# Patient Record
Sex: Female | Born: 1965
Health system: Southern US, Community
[De-identification: ages and names within clinical notes are randomized; demographics above are authoritative.]

## PROBLEM LIST (undated history)

## (undated) DIAGNOSIS — R319 Hematuria, unspecified: Secondary | ICD-10-CM

## (undated) DIAGNOSIS — I2510 Atherosclerotic heart disease of native coronary artery without angina pectoris: Secondary | ICD-10-CM

## (undated) DIAGNOSIS — E041 Nontoxic single thyroid nodule: Secondary | ICD-10-CM

## (undated) DIAGNOSIS — E785 Hyperlipidemia, unspecified: Secondary | ICD-10-CM

## (undated) DIAGNOSIS — R109 Unspecified abdominal pain: Secondary | ICD-10-CM

## (undated) DIAGNOSIS — I10 Essential (primary) hypertension: Secondary | ICD-10-CM

## (undated) DIAGNOSIS — R0602 Shortness of breath: Secondary | ICD-10-CM

## (undated) DIAGNOSIS — N390 Urinary tract infection, site not specified: Secondary | ICD-10-CM

## (undated) DIAGNOSIS — N950 Postmenopausal bleeding: Secondary | ICD-10-CM

## (undated) DIAGNOSIS — K760 Fatty (change of) liver, not elsewhere classified: Secondary | ICD-10-CM

## (undated) HISTORY — DX: Hematuria, unspecified: R31.9

## (undated) HISTORY — DX: Hyperlipidemia, unspecified: E78.5

## (undated) HISTORY — PX: OTHER SURGICAL HISTORY: SHX169

## (undated) HISTORY — PX: CYSTOSCOPY: SUR368

## (undated) HISTORY — PX: TUBAL LIGATION: SHX77

## (undated) HISTORY — DX: Urinary tract infection, site not specified: N39.0

## (undated) HISTORY — PX: WISDOM TOOTH EXTRACTION: SHX21

## (undated) HISTORY — DX: Essential (primary) hypertension: I10

## (undated) HISTORY — DX: Shortness of breath: R06.02

## (undated) HISTORY — PX: CYST REMOVAL HAND: SHX6279

## (undated) HISTORY — DX: Unspecified abdominal pain: R10.9

## (undated) HISTORY — DX: Atherosclerotic heart disease of native coronary artery without angina pectoris: I25.10

## (undated) HISTORY — PX: KNEE SURGERY: SHX244

## (undated) HISTORY — PX: CERVICAL DISC ARTHROPLASTY: SHX587

## (undated) HISTORY — DX: Fatty (change of) liver, not elsewhere classified: K76.0

---

## 1998-08-30 DIAGNOSIS — J189 Pneumonia, unspecified organism: Secondary | ICD-10-CM

## 1998-08-30 HISTORY — DX: Pneumonia, unspecified organism: J18.9

## 1998-09-12 ENCOUNTER — Encounter: Payer: Self-pay | Admitting: Obstetrics and Gynecology

## 1998-09-12 ENCOUNTER — Inpatient Hospital Stay (HOSPITAL_COMMUNITY): Admission: RE | Admit: 1998-09-12 | Discharge: 1998-09-14 | Payer: Self-pay | Admitting: Obstetrics and Gynecology

## 1998-11-01 ENCOUNTER — Inpatient Hospital Stay (HOSPITAL_COMMUNITY): Admission: AD | Admit: 1998-11-01 | Discharge: 1998-11-01 | Payer: Self-pay | Admitting: Obstetrics & Gynecology

## 1998-11-03 ENCOUNTER — Inpatient Hospital Stay (HOSPITAL_COMMUNITY): Admission: AD | Admit: 1998-11-03 | Discharge: 1998-11-05 | Payer: Self-pay | Admitting: Obstetrics and Gynecology

## 1998-12-05 ENCOUNTER — Other Ambulatory Visit: Admission: RE | Admit: 1998-12-05 | Discharge: 1998-12-05 | Payer: Self-pay | Admitting: Obstetrics and Gynecology

## 1999-12-19 ENCOUNTER — Other Ambulatory Visit: Admission: RE | Admit: 1999-12-19 | Discharge: 1999-12-19 | Payer: Self-pay | Admitting: Obstetrics & Gynecology

## 2000-01-15 ENCOUNTER — Emergency Department (HOSPITAL_COMMUNITY): Admission: EM | Admit: 2000-01-15 | Discharge: 2000-01-15 | Payer: Self-pay | Admitting: Emergency Medicine

## 2000-01-15 ENCOUNTER — Encounter: Payer: Self-pay | Admitting: Emergency Medicine

## 2000-01-30 HISTORY — PX: KNEE SURGERY: SHX244

## 2000-02-26 ENCOUNTER — Ambulatory Visit (HOSPITAL_BASED_OUTPATIENT_CLINIC_OR_DEPARTMENT_OTHER): Admission: RE | Admit: 2000-02-26 | Discharge: 2000-02-27 | Payer: Self-pay | Admitting: Orthopaedic Surgery

## 2000-12-31 ENCOUNTER — Other Ambulatory Visit: Admission: RE | Admit: 2000-12-31 | Discharge: 2000-12-31 | Payer: Self-pay | Admitting: Obstetrics & Gynecology

## 2001-12-11 ENCOUNTER — Ambulatory Visit (HOSPITAL_COMMUNITY): Admission: RE | Admit: 2001-12-11 | Discharge: 2001-12-11 | Payer: Self-pay | Admitting: Family Medicine

## 2001-12-11 ENCOUNTER — Encounter: Payer: Self-pay | Admitting: Family Medicine

## 2001-12-31 ENCOUNTER — Encounter: Payer: Self-pay | Admitting: Family Medicine

## 2001-12-31 ENCOUNTER — Encounter: Admission: RE | Admit: 2001-12-31 | Discharge: 2001-12-31 | Payer: Self-pay | Admitting: Family Medicine

## 2002-01-07 ENCOUNTER — Other Ambulatory Visit: Admission: RE | Admit: 2002-01-07 | Discharge: 2002-01-07 | Payer: Self-pay | Admitting: Obstetrics & Gynecology

## 2003-01-10 ENCOUNTER — Other Ambulatory Visit: Admission: RE | Admit: 2003-01-10 | Discharge: 2003-01-10 | Payer: Self-pay | Admitting: Obstetrics & Gynecology

## 2004-02-13 ENCOUNTER — Other Ambulatory Visit: Admission: RE | Admit: 2004-02-13 | Discharge: 2004-02-13 | Payer: Self-pay | Admitting: Obstetrics & Gynecology

## 2004-03-16 ENCOUNTER — Ambulatory Visit (HOSPITAL_COMMUNITY): Admission: RE | Admit: 2004-03-16 | Discharge: 2004-03-16 | Payer: Self-pay | Admitting: Obstetrics & Gynecology

## 2005-02-19 ENCOUNTER — Other Ambulatory Visit: Admission: RE | Admit: 2005-02-19 | Discharge: 2005-02-19 | Payer: Self-pay | Admitting: Obstetrics & Gynecology

## 2006-12-03 ENCOUNTER — Encounter: Admission: RE | Admit: 2006-12-03 | Discharge: 2006-12-03 | Payer: Self-pay | Admitting: Family Medicine

## 2007-07-02 HISTORY — PX: WISDOM TOOTH EXTRACTION: SHX21

## 2007-08-18 ENCOUNTER — Encounter: Admission: RE | Admit: 2007-08-18 | Discharge: 2007-08-18 | Payer: Self-pay | Admitting: Family Medicine

## 2010-11-16 NOTE — Op Note (Signed)
NAME:  Christine Nolan, Christine Nolan                           ACCOUNT NO.:  [id number]   MEDICAL RECORD NO.:  [id number]                   PATIENT TYPE:  AMB   LOCATION:  SDC                                  FACILITY:  [facility]   PHYSICIAN:  Gerrit Friends. Aldona Bar, M.D.                DATE OF BIRTH:  June 07, 1966   DATE OF PROCEDURE:  03/16/2004  DATE OF DISCHARGE:                                 OPERATIVE REPORT   PREOPERATIVE DIAGNOSES:  Desire for permanent elective sterilization.   POSTOPERATIVE DIAGNOSES:  Desire for permanent elective sterilization.   PROCEDURE:  Laparoscopic tubal coagulation for permanent elective  sterilization.   SURGEON:  Gerrit Friends. Aldona Bar, M.D.   ANESTHESIA:  General endotracheal.   HISTORY:  This 45 year old gravida 2, para 2 current on birth control pills  requests a permanent elective sterilization procedure. She understands that  such procedures are meant to be 100% permanent but unfortunately is not 100%  perfect--subsequent pregnancy can result.  According to her wishes, she is  now being taken to the operating room for a sterilization procedure.   DESCRIPTION OF PROCEDURE:  The patient was taken to the operating room and  after the satisfactory induction of general endotracheal anesthesia, she was  prepped and draped having been placed in the modified lithotomy position in  the short Allen stirrups. The bladder was drained of clear urine in an in  and out fashion as part of the prep and a Hulka tenaculum placed on the  cervix for uterine mobility during the procedure. At this time after the  patient was adequately draped, the procedure was begun.   A 1 cm subumbilical midline transverse skin incision was made and through  this a large trocar and sleeve was introduced without difficulty. The large  trocar was removed through the large sleeve. The laparoscope was introduced  and intraabdominal structures were seen and at this time a pneumoperitoneum  was created with  approximately 3 liters of carbon dioxide gas.   At this time, the abdomen was inspected--the liver edge and the surface of  the diaphragm appeared normal. The appendix was not visualized. The uterus,  tubes and ovaries appeared completely normal as did the cul-de-sac and dome  of the bladder.   At this time through a 5 mm suprapubic midline transverse skin incision, the  accessory trocar and sleeve was introduced. The accessory trocar was removed  through the accessory sleeve. The bipolar coagulation instrument was  introduced after it had been appropriately tested. At this time, the right  fallopian tube was identified, traced out to the fimbriated end for positive  identification, and then in the mid portion of the right fallopian tube an  area measuring approximately 2 cm was adequately coagulated.  A similar  procedure was carried out on the left fallopian tube. At this time, assured  of adequate coagulation in a segment of each fallopian tube  and unaware of  any pathologic findings the procedure was felt to be complete. The  coagulating instrument was removed from the accessory sleeve and under  direct visualization the accessory sleeve was removed and the underneath  surface of the peritoneum was dry. At this time, the laparoscope was  removed, pneumoperitoneum reduced and large sleeve removed. Skin incisions  were closed with 4-0 Vicryl in an interrupted subcuticular fashion.  Pressure dressings were applied. The Hulka tenaculum was removed from the  cervix and the patient was awakened and transported to the recovery room in  satisfactory condition having tolerated the procedure well.  Estimated blood  loss negligible. All counts correct x2.  Pathologic specimens none.   The patient will be observed in recovery and discharged to home with  appropriate instructions and a prescription for Tylox to use 1-2 every 4-6h  as needed for pain and Zofran 4 mg tablets to use every 4-6h as  needed for  nausea and vomiting. She will return to the office for followup in  approximately 3-4 weeks time. She will finish her current pack of birth  control pills as directed. Condition on arrival to recovery satisfactory.                                               Gerrit Friends. Aldona Bar, M.D.    RMW/MEDQ  D:  03/16/2004  T:  03/16/2004  Job:  [id number]

## 2010-11-16 NOTE — Op Note (Signed)
[facility]  Patient:    Christine Nolan, Christine Nolan                          MRN: [id number] Proc. Date: 02/26/00 Attending:  Lubertha Basque. Jerl Santos, M.D.                           Operative Report  PREOPERATIVE DIAGNOSIS: 1. Left knee loose bodies. 2. Left knee chondromalacia patella. 3. Left knee dislocating patella.  POSTOPERATIVE DIAGNOSIS: 1. Left knee loose bodies. 2. Left knee chondromalacia patella. 3. Left knee dislocating patella.  OPERATION PERFORMED: 1. Left knee removal of loose bodies x 3. 2. Left knee chondroplasty patella. 3. Left knee extensor mechanism realignment.  ANESTHESIA:  General.  ATTENDING SURGEON:  Lubertha Basque. Jerl Santos, M.D.  ASSISTANT:  Lindwood Qua, P.A.  INDICATIONS FOR PROCEDURE:  The patient is a 45 year old woman with a history of a dislocating left kneecap.  This happened most recently a couple of months ago.  She has been unable to get her effusion down and has had difficulty regaining strength in the leg.  She remains quite apprehensive about her patella dislocating.  She was offered operative intervention to consist of an arthroscopy plus an open extensor mechanism realignment.  The procedure was discussed with the patient and informed operative consent was obtained after discussion of possible complications of reaction to anesthesia, infection, neurovascular injury, and knee stiffness.  She also understood about the extensive rehabilitation program required after this type of operation.  DESCRIPTION OF PROCEDURE:  The patient was taken to an operating suite where general anesthetic was applied without difficulty.  She was then positioned supine and prepped and draped in normal sterile fashion.  After the administration of preop IV antibiotic, an arthroscopy of the left knee was performed through a total of three portals.  The suprapatellar pouch was benign while the patellofemoral joint tracked in the far lateral  position. This did not even come into the groove at 90 or 100 degrees flexion.  She had some grade 3 and grade  4 change across the patella.  A thorough chondroplasty was performed.  About half the patella was affected with the degenerative changes.  She had several large cartilaginous loose bodies loose in the knee. These were removed without difficulty.  The medial and lateral compartment showed no evidence of meniscal or articular cartilage injury.  These cartilaginous loose bodies were felt to emanate from the patella.  The VMO appeared to be at least partially detached from the superomedial aspect of the patella.  The ACL and the PCL were intact.  The knee was thoroughly irrigated with the solution followed by removal of the arthroscopic equipment.  The leg was then elevated, exsanguinated, and a tourniquet inflated about then thigh. An anterior incision was made from the tibial tubercle distal with dissection down to the patellar tendon insertion on the tibial tubercle.  A lateral release was performed with  Mayo scissors up to a lateral release which had been done through the scope through the aforementioned additional third portal.  These releases were connected and at that point she had good mobility of her patella.  I then used an oscillating saw to recreate a tibial tubercle osteotomy.  This was done with a slight bevel so as to bring the patella in an anterior direction as it was translocated 1 cm in a medial direction.  I stabilized the tubercle  with two partially threaded cancellous screws from the small fragment set.  We placed a washer with each screw.  Fluoroscopy was used to confirm placement of hardware and I read all these views myself to make appropriate intraoperative decisions.  A second incision was then made over the superomedial aspect of the patella with dissection down to the VMO.  This was then brought over the patella and advanced approximately 1 cm onto  the upper surface of the patella with #1 Vicryl suture.  The scope was then reintroduced into the knee and the patella tracked in a good position starting at about 60 degrees of knee flexion.  The knee again was thoroughly irrigated and a brief chondroplasty was repeated on the patella.  It was again noted that she had some grade 3 and grade 4 change on about 50% of the patella.  The arthroscopic equipment was removed.  The tourniquet was deflated and the leg became pink and warm immediately.  2-0 undyed Vicryl was used to reapproximate the two incisions followed by nylon to close the skin.  Adaptic was placed on all wounds, followed by dry gauze and a loose Ace wrap.  Estimated blood loss and intraoperative fluids can be obtained from Anesthesia records as can accurate tourniquet time, which was less than an hour.  DISPOSITION:  The patient was taken to the recovery room in stable condition. Plans were for her to stay overnight with probable discharged home in the morning. DD:  02/26/00 TD:  02/26/00 Job: [id number]

## 2010-11-30 HISTORY — PX: CERVICAL FUSION: SHX112

## 2010-12-06 ENCOUNTER — Other Ambulatory Visit: Payer: Self-pay | Admitting: Family Medicine

## 2010-12-06 DIAGNOSIS — M542 Cervicalgia: Secondary | ICD-10-CM

## 2010-12-08 ENCOUNTER — Ambulatory Visit
Admission: RE | Admit: 2010-12-08 | Discharge: 2010-12-08 | Disposition: A | Discharge status: Home / Self Care | Payer: 59 | Source: Ambulatory Visit | Attending: Family Medicine | Admitting: Family Medicine

## 2010-12-08 DIAGNOSIS — M542 Cervicalgia: Secondary | ICD-10-CM

## 2011-01-30 ENCOUNTER — Other Ambulatory Visit (HOSPITAL_COMMUNITY): Payer: Self-pay | Admitting: Neurosurgery

## 2011-01-30 DIAGNOSIS — M5020 Other cervical disc displacement, unspecified cervical region: Secondary | ICD-10-CM

## 2011-06-04 ENCOUNTER — Other Ambulatory Visit (HOSPITAL_COMMUNITY): Payer: 59

## 2011-06-11 ENCOUNTER — Ambulatory Visit (HOSPITAL_COMMUNITY)
Admission: RE | Admit: 2011-06-11 | Discharge: 2011-06-11 | Disposition: A | Discharge status: Home / Self Care | Payer: Self-pay | Source: Ambulatory Visit | Attending: Neurosurgery | Admitting: Neurosurgery

## 2011-06-11 DIAGNOSIS — M5020 Other cervical disc displacement, unspecified cervical region: Secondary | ICD-10-CM | POA: Insufficient documentation

## 2015-08-23 ENCOUNTER — Other Ambulatory Visit: Payer: 59

## 2015-08-23 ENCOUNTER — Other Ambulatory Visit: Payer: Self-pay | Admitting: Family Medicine

## 2015-08-23 DIAGNOSIS — M25562 Pain in left knee: Secondary | ICD-10-CM

## 2016-07-03 DIAGNOSIS — R0602 Shortness of breath: Secondary | ICD-10-CM | POA: Diagnosis not present

## 2016-07-03 DIAGNOSIS — R8299 Other abnormal findings in urine: Secondary | ICD-10-CM | POA: Diagnosis not present

## 2016-07-03 DIAGNOSIS — R05 Cough: Secondary | ICD-10-CM | POA: Diagnosis not present

## 2016-07-11 ENCOUNTER — Other Ambulatory Visit: Payer: Self-pay | Admitting: Physician Assistant

## 2016-07-11 ENCOUNTER — Ambulatory Visit
Admission: RE | Admit: 2016-07-11 | Discharge: 2016-07-11 | Disposition: A | Discharge status: Home / Self Care | Payer: 59 | Source: Ambulatory Visit | Attending: Physician Assistant | Admitting: Physician Assistant

## 2016-07-11 DIAGNOSIS — R05 Cough: Secondary | ICD-10-CM

## 2016-07-11 DIAGNOSIS — R0602 Shortness of breath: Secondary | ICD-10-CM | POA: Diagnosis not present

## 2016-07-11 DIAGNOSIS — R059 Cough, unspecified: Secondary | ICD-10-CM

## 2016-09-05 DIAGNOSIS — R0602 Shortness of breath: Secondary | ICD-10-CM | POA: Diagnosis not present

## 2016-09-05 DIAGNOSIS — I10 Essential (primary) hypertension: Secondary | ICD-10-CM | POA: Diagnosis not present

## 2016-09-05 DIAGNOSIS — R079 Chest pain, unspecified: Secondary | ICD-10-CM | POA: Diagnosis not present

## 2016-09-11 ENCOUNTER — Ambulatory Visit (INDEPENDENT_AMBULATORY_CARE_PROVIDER_SITE_OTHER): Payer: 59

## 2016-09-11 DIAGNOSIS — R079 Chest pain, unspecified: Secondary | ICD-10-CM | POA: Diagnosis not present

## 2016-09-11 LAB — EXERCISE TOLERANCE TEST
CHL CUP MPHR: 168 {beats}/min
CHL CUP RESTING HR STRESS: 93 {beats}/min
CSEPPHR: 148 {beats}/min
Estimated workload: 7.7 METS
Exercise duration (min): 6 min
Exercise duration (sec): 30 s
Percent HR: 88 %
RPE: 13

## 2016-12-03 DIAGNOSIS — R109 Unspecified abdominal pain: Secondary | ICD-10-CM | POA: Diagnosis not present

## 2016-12-03 DIAGNOSIS — R300 Dysuria: Secondary | ICD-10-CM | POA: Diagnosis not present

## 2016-12-03 DIAGNOSIS — N390 Urinary tract infection, site not specified: Secondary | ICD-10-CM | POA: Diagnosis not present

## 2016-12-06 ENCOUNTER — Other Ambulatory Visit: Payer: Self-pay | Admitting: Physician Assistant

## 2016-12-06 DIAGNOSIS — R319 Hematuria, unspecified: Secondary | ICD-10-CM | POA: Diagnosis not present

## 2016-12-06 DIAGNOSIS — R109 Unspecified abdominal pain: Secondary | ICD-10-CM

## 2016-12-06 DIAGNOSIS — N390 Urinary tract infection, site not specified: Secondary | ICD-10-CM | POA: Diagnosis not present

## 2016-12-09 ENCOUNTER — Encounter (INDEPENDENT_AMBULATORY_CARE_PROVIDER_SITE_OTHER): Payer: Self-pay

## 2016-12-09 ENCOUNTER — Ambulatory Visit
Admission: RE | Admit: 2016-12-09 | Discharge: 2016-12-09 | Disposition: A | Discharge status: Home / Self Care | Payer: 59 | Source: Ambulatory Visit | Attending: Physician Assistant | Admitting: Physician Assistant

## 2016-12-09 DIAGNOSIS — K760 Fatty (change of) liver, not elsewhere classified: Secondary | ICD-10-CM | POA: Diagnosis not present

## 2016-12-09 DIAGNOSIS — R109 Unspecified abdominal pain: Secondary | ICD-10-CM

## 2017-01-13 ENCOUNTER — Encounter: Payer: Self-pay | Admitting: Interventional Cardiology

## 2017-01-13 ENCOUNTER — Ambulatory Visit (INDEPENDENT_AMBULATORY_CARE_PROVIDER_SITE_OTHER): Payer: 59 | Admitting: Interventional Cardiology

## 2017-01-13 VITALS — BP 160/96 | HR 78 | Ht 68.0 in | Wt 173.8 lb

## 2017-01-13 DIAGNOSIS — I2510 Atherosclerotic heart disease of native coronary artery without angina pectoris: Secondary | ICD-10-CM

## 2017-01-13 DIAGNOSIS — I10 Essential (primary) hypertension: Secondary | ICD-10-CM

## 2017-01-13 DIAGNOSIS — E782 Mixed hyperlipidemia: Secondary | ICD-10-CM

## 2017-01-13 DIAGNOSIS — E785 Hyperlipidemia, unspecified: Secondary | ICD-10-CM | POA: Insufficient documentation

## 2017-01-13 NOTE — Patient Instructions (Addendum)
Medication Instructions:  Your physician recommends that you continue on your current medications as directed. Please refer to the Current Medication list given to you today.   Labwork: None ordered  Testing/Procedures: None ordered  Follow-Up: Your physician wants you to follow-up in: 1 year with Dr. Eldridge DaceVaranasi. You will receive a reminder letter in the mail two months in advance. If you don't receive a letter, please call our office to schedule the follow-up appointment.   Any Other Special Instructions Will Be Listed Below (If Applicable).  Check your BP about once a week and let us or Dr. Clelia CroftShaw know if you are consistently getting readings above 140/90.   If you need a refill on your cardiac medications before your next appointment, please call your pharmacy.

## 2017-01-13 NOTE — Progress Notes (Addendum)
Cardiology Office Note   Date:  01/13/2017   ID:  Christine Nolan, DOB [birth year], MRN [id number]  PCP:  Christine Raider, Christine Nolan    Chief Complaint  Patient presents with  . New Patient (Initial Visit)    family hx     Wt Readings from Last 3 Encounters:  01/13/17 173 lb 12.8 oz (78.8 kg)       History of Present Illness:  Christine Nolan is a 51 y.o. female who is being seen today for the evaluation of coronary artery calcification and family h/o CAD at the request of Christine Raider, Christine Nolan. Christine Nolan is a 51 y.o. female  Who had a CT scan for frequent UTIs .  This showed atherosclerosis in the aorta and coronary arteries, with aortic valve calcification.  She has family h/o CAD.  Her daughter recently had SCAD.    Father with AFib and leaky valve.  Mother had CAD in her mid 84s with CABG.  She was overweight.   She had some GERD sx in 3/18 prompting a treadmill.  She was stressed at the time as well.   She had a stress test in 3/18:  The patient walked for 6:30 of a standard Bruce protocol. She achieved a peak HR of 148 which is 88% predicted maximal HR .  There was insignificant nonspecific ST depression in the lateral leads which resolved quickly in the recovery phase. These ST changes were likely due to to the hypertensive response .  Blood pressure demonstrated a hypertensive response to exercise.  Negative GXT  Since the ETT:  Denies : Chest pain. Dizziness. Leg edema. Nitroglycerin use. Orthopnea. Palpitations. Paroxysmal nocturnal dyspnea. Shortness of breath. Syncope.   She does not do any regular exercise.  She was recently at a beach and waed a lot without problems.    She was on Lexapro for a little while but stopped it.  She occasionally takes Ativan.      Past Medical History:  Diagnosis Date  . CAD (coronary artery disease)   . Fatty liver   . Flank pain   . Hematuria, unspecified   . Hyperlipidemia   . Hypertension   . SOB (shortness of breath)    . UTI (urinary tract infection)     Past Surgical History:  Procedure Laterality Date  . btl    . CERVICAL DISC ARTHROPLASTY    . CYST REMOVAL HAND    . KNEE SURGERY Left    knee repair  . left foot surgery    . WISDOM TOOTH EXTRACTION       Current Outpatient Prescriptions  Medication Sig Dispense Refill  . atorvastatin (LIPITOR) 20 MG tablet Take 20 mg by mouth daily.    . Calcium Carbonate-Vitamin D3 (CALCIUM 600/VITAMIN D) 600-400 MG-UNIT TABS Take 1 tablet by mouth daily.     . drospirenone-ethinyl estradiol (YAZ,GIANVI,LORYNA) 3-0.02 MG tablet Take 1 tablet by mouth daily.    Marland Kitchen LORazepam (ATIVAN) 0.5 MG tablet TAKE 1/2 TO 1 TABLET BY MOUTH TWICE A DAY AS NEEDED FOR ANXIETY    . METOPROLOL SUCCINATE ER PO Take 50 mg by mouth daily.    . Multiple Vitamins-Minerals (MULTIVITAMIN ADULT PO) Take 1 tablet by mouth daily.     . Omega-3 Fatty Acids (FISH OIL) 1200 MG CAPS TAKE 2 CAPSULES BY MOUTH TWICE A DAY    . promethazine (PHENERGAN) 25 MG tablet TAKE 1-2 TABLETS BY MOUTH EVERY 6 HOURS AS NEEDED FOR NAUSEA    .  SUMAtriptan (IMITREX) 100 MG tablet Take 100 mg by mouth every 2 (two) hours as needed for migraine. May repeat in 2 hours if headache persists or recurs.     No current facility-administered medications for this visit.     Allergies:   Iodine; Shellfish allergy; and Tamiflu [oseltamivir phosphate]    Social History:  The patient  reports that she has never smoked. She has never used smokeless tobacco. She reports that she does not drink alcohol or use drugs.   Family History:  The patient's family history includes Heart attack (age of onset: 3428) in her daughter; Heart attack (age of onset: 5860) in her mother; Heart disease in her father.    ROS:  Please see the history of present illness.   Otherwise, review of systems are positive for anxiety intermittently.   All other systems are reviewed and negative.    PHYSICAL EXAM: VS:  BP (!) 160/96   Pulse 78   Ht 5'  8" (1.727 m)   Wt 173 lb 12.8 oz (78.8 kg)   BMI 26.43 kg/m  , BMI Body mass index is 26.43 kg/m. GEN: Well nourished, well developed, in no acute distress  HEENT: normal  Neck: no JVD, carotid bruits, or masses Cardiac: RRR; no murmurs, rubs, or gallops,no edema  Respiratory:  clear to auscultation bilaterally, normal work of breathing GI: soft, nontender, nondistended, + BS MS: no deformity or atrophy  Skin: warm and dry, no rash Neuro:  Strength and sensation are intact Psych: euthymic mood, full affect   EKG:   The ekg ordered 3/18 demonstrates NSR, nonspecific ST changes   Recent Labs: No results found for requested labs within last 8760 hours.   Lipid Panel No results found for: CHOL, TRIG, HDL, CHOLHDL, VLDL, LDLCALC, LDLDIRECT   Other studies Reviewed: Additional studies/ records that were reviewed today with results demonstrating: ETT reviewed.   ASSESSMENT AND PLAN:  1. Coronary calcifications noted by CT scan: Negative exercise treadmill test. No angina. Continue aggressive preventative therapy. We talked about keeping her blood pressure and lipids under control. She needs to start exercising as well. She should try to get 5 days a week 30 minutes a day of some aerobic activity.  2. Hyperlipidemia: Lipids well controlled on current dose of atorvastatin. Continue healthy diet as well. 3. Hypertension: She will check her blood pressure more outside of the doctor's office. Continue healthy diet. Increase activity will also help. 4. She did inquire about further testing given the atherosclerotic findings. I don't think any further imaging is necessary since she had a negative treadmill test and she is not having any anginal symptoms. Certainly if her symptoms change or exercise tolerance decreases, could rethink this approach.   Current medicines are reviewed at length with the patient today.  The patient concerns regarding her medicines were addressed.  The following  changes have been made:  No change  Labs/ tests ordered today include:  No orders of the defined types were placed in this encounter.   Recommend 150 minutes/week of aerobic exercise Low fat, low carb, high fiber diet recommended  Disposition:   FU in 1 year   Signed, Lance MussJayadeep Jovanie Verge, Christine Nolan  01/13/2017 10:38 AM    [facility] [address], [place], [place]  [address] Phone: [phone]; Fax: [phone]

## 2017-03-12 DIAGNOSIS — Z23 Encounter for immunization: Secondary | ICD-10-CM | POA: Diagnosis not present

## 2017-03-12 DIAGNOSIS — R8290 Unspecified abnormal findings in urine: Secondary | ICD-10-CM | POA: Diagnosis not present

## 2017-03-27 DIAGNOSIS — N390 Urinary tract infection, site not specified: Secondary | ICD-10-CM | POA: Diagnosis not present

## 2017-03-27 DIAGNOSIS — B9620 Unspecified Escherichia coli [E. coli] as the cause of diseases classified elsewhere: Secondary | ICD-10-CM | POA: Diagnosis not present

## 2017-05-20 DIAGNOSIS — N390 Urinary tract infection, site not specified: Secondary | ICD-10-CM | POA: Diagnosis not present

## 2017-06-03 DIAGNOSIS — Z01419 Encounter for gynecological examination (general) (routine) without abnormal findings: Secondary | ICD-10-CM | POA: Diagnosis not present

## 2017-06-11 DIAGNOSIS — Z23 Encounter for immunization: Secondary | ICD-10-CM | POA: Diagnosis not present

## 2017-06-11 DIAGNOSIS — I10 Essential (primary) hypertension: Secondary | ICD-10-CM | POA: Diagnosis not present

## 2017-06-11 DIAGNOSIS — Z0000 Encounter for general adult medical examination without abnormal findings: Secondary | ICD-10-CM | POA: Diagnosis not present

## 2017-06-17 DIAGNOSIS — Z1231 Encounter for screening mammogram for malignant neoplasm of breast: Secondary | ICD-10-CM | POA: Diagnosis not present

## 2017-07-18 DIAGNOSIS — M25531 Pain in right wrist: Secondary | ICD-10-CM | POA: Diagnosis not present

## 2017-08-11 DIAGNOSIS — N959 Unspecified menopausal and perimenopausal disorder: Secondary | ICD-10-CM | POA: Diagnosis not present

## 2017-08-19 ENCOUNTER — Other Ambulatory Visit: Payer: Self-pay | Admitting: Family Medicine

## 2017-08-19 DIAGNOSIS — Z8249 Family history of ischemic heart disease and other diseases of the circulatory system: Secondary | ICD-10-CM

## 2017-08-29 ENCOUNTER — Ambulatory Visit
Admission: RE | Admit: 2017-08-29 | Discharge: 2017-08-29 | Disposition: A | Discharge status: Home / Self Care | Payer: No Typology Code available for payment source | Source: Ambulatory Visit | Attending: Family Medicine | Admitting: Family Medicine

## 2017-08-29 DIAGNOSIS — Z8249 Family history of ischemic heart disease and other diseases of the circulatory system: Secondary | ICD-10-CM

## 2017-08-31 DIAGNOSIS — R05 Cough: Secondary | ICD-10-CM | POA: Diagnosis not present

## 2017-08-31 DIAGNOSIS — J069 Acute upper respiratory infection, unspecified: Secondary | ICD-10-CM | POA: Diagnosis not present

## 2017-09-10 DIAGNOSIS — N390 Urinary tract infection, site not specified: Secondary | ICD-10-CM | POA: Diagnosis not present

## 2017-09-15 ENCOUNTER — Emergency Department (HOSPITAL_COMMUNITY): Payer: 59

## 2017-09-15 ENCOUNTER — Emergency Department (HOSPITAL_COMMUNITY)
Admission: EM | Admit: 2017-09-15 | Discharge: 2017-09-15 | Disposition: A | Discharge status: Home / Self Care | Payer: 59 | Attending: Emergency Medicine | Admitting: Emergency Medicine

## 2017-09-15 ENCOUNTER — Encounter (HOSPITAL_COMMUNITY): Payer: Self-pay | Admitting: Emergency Medicine

## 2017-09-15 DIAGNOSIS — I2510 Atherosclerotic heart disease of native coronary artery without angina pectoris: Secondary | ICD-10-CM | POA: Diagnosis not present

## 2017-09-15 DIAGNOSIS — I10 Essential (primary) hypertension: Secondary | ICD-10-CM | POA: Diagnosis not present

## 2017-09-15 DIAGNOSIS — R079 Chest pain, unspecified: Secondary | ICD-10-CM

## 2017-09-15 DIAGNOSIS — R0789 Other chest pain: Secondary | ICD-10-CM | POA: Insufficient documentation

## 2017-09-15 LAB — BASIC METABOLIC PANEL
ANION GAP: 15 (ref 5–15)
BUN: 17 mg/dL (ref 6–20)
CHLORIDE: 103 mmol/L (ref 101–111)
CO2: 22 mmol/L (ref 22–32)
Calcium: 9.6 mg/dL (ref 8.9–10.3)
Creatinine, Ser: 0.98 mg/dL (ref 0.44–1.00)
GFR calc non Af Amer: >60 mL/min (ref >60)
Glucose, Bld: 104 mg/dL — ABNORMAL HIGH (ref 65–99)
Potassium: 3.6 mmol/L (ref 3.5–5.1)
Sodium: 140 mmol/L (ref 135–145)

## 2017-09-15 LAB — I-STAT TROPONIN, ED
TROPONIN I, POC: 0 ng/mL (ref 0.00–0.08)
TROPONIN I, POC: 0 ng/mL (ref 0.00–0.08)

## 2017-09-15 LAB — CBC
HCT: 43.5 % (ref 36.0–46.0)
Hemoglobin: 14.7 g/dL (ref 12.0–15.0)
MCH: 31.1 pg (ref 26.0–34.0)
MCHC: 33.8 g/dL (ref 30.0–36.0)
MCV: 92 fL (ref 78.0–100.0)
Platelets: 252 10*3/uL (ref 150–400)
RBC: 4.73 MIL/uL (ref 3.87–5.11)
RDW: 14.1 % (ref 11.5–15.5)
WBC: 8.4 10*3/uL (ref 4.0–10.5)

## 2017-09-15 LAB — I-STAT BETA HCG BLOOD, ED (MC, WL, AP ONLY): HCG, QUANTITATIVE: 6.4 m[IU]/mL — AB (ref <5)

## 2017-09-15 MED ORDER — FAMOTIDINE 20 MG PO TABS: 20.0000 mg | ORAL_TABLET | Freq: Two times a day (BID) | ORAL | 0 refills | Status: DC

## 2017-09-15 MED ORDER — GI COCKTAIL ~~LOC~~
30.0000 mL | Freq: Once | ORAL | Status: AC
  Administered 2017-09-15: 30 mL via ORAL
  Filled 2017-09-15: qty 30

## 2017-09-15 MED ORDER — KETOROLAC TROMETHAMINE 30 MG/ML IJ SOLN
30.0000 mg | Freq: Once | INTRAMUSCULAR | Status: AC
  Administered 2017-09-15: 30 mg via INTRAMUSCULAR
  Filled 2017-09-15: qty 1

## 2017-09-15 NOTE — ED Provider Notes (Signed)
[facility]-EMERGENCY DEPT Provider Note   CSN: [id number] Arrival date & time: 09/15/17  1701     History   Chief Complaint Chief Complaint  Patient presents with  . Chest Pain    HPI Christine Nolan is a 52 y.o. female.  52 year old female with prior history of hyperlipidemia, hypertension, and anxiety presents with the complaint of intermittent left-sided chest pain.  Patient reports that her symptoms started about 4 days previously.  Symptoms are intermittent.  She describes intermittent left-sided chest discomfort.  She denies associated shortness of breath.  She reports that her discomfort last anywhere from 30 minutes to 2 hours.  She has tried taking over-the-counter heartburn medications without significant improvement.  She has taken as needed Ativan (for anxiety) without significant improvement.  She was currently pain-free and without significant discomfort. Her last reported episd  She already has arranged a cardiology appointment for tomorrow morning.  She reports that her last stress test was less than 12 months previously and it was normal.  She denies a prior history of CAD or prior cardiac catheterization.    The history is provided by the patient.  Chest Pain   This is a recurrent problem. The current episode started more than 2 days ago. The problem has not changed since onset.The pain is present in the lateral region. The patient is experiencing no pain. The quality of the pain is described as pressure-like. The pain does not radiate. She has tried nothing for the symptoms. The treatment provided no relief.    Past Medical History:  Diagnosis Date  . CAD (coronary artery disease)   . Fatty liver   . Flank pain   . Hematuria, unspecified   . Hyperlipidemia   . Hypertension   . SOB (shortness of breath)   . UTI (urinary tract infection)     Patient Active Problem List   Diagnosis Date Noted  . Hyperlipidemia 01/13/2017  . Essential  hypertension 01/13/2017  . Coronary artery calcification seen on CAT scan 01/13/2017    Past Surgical History:  Procedure Laterality Date  . btl    . CERVICAL DISC ARTHROPLASTY    . CYST REMOVAL HAND    . KNEE SURGERY Left    knee repair  . left foot surgery    . WISDOM TOOTH EXTRACTION      OB History    No data available       Home Medications    Prior to Admission medications   Medication Sig Start Date End Date Taking? Authorizing Provider  acetaminophen (TYLENOL) 500 MG tablet Take 500 mg by mouth every 6 (six) hours as needed for moderate pain.   Yes [provider]  atorvastatin (LIPITOR) 20 MG tablet Take 20 mg by mouth daily.   Yes [provider]  Calcium Carbonate-Vitamin D3 (CALCIUM 600/VITAMIN D) 600-400 MG-UNIT TABS Take 1 tablet by mouth daily.    Yes [provider]  dimenhyDRINATE (DRAMAMINE) 50 MG tablet Take 50 mg by mouth every 8 (eight) hours as needed for dizziness.   Yes [provider]  estradiol (ESTRACE) 2 MG tablet Take 2 mg by mouth daily. 09/01/17  Yes [provider]  LORazepam (ATIVAN) 0.5 MG tablet TAKE 1/2 TO 1 TABLET BY MOUTH TWICE A DAY AS NEEDED FOR ANXIETY   Yes [provider]  medroxyPROGESTERone (PROVERA) 2.5 MG tablet Take 2.5 mg by mouth daily. 09/10/17  Yes [provider]  metoprolol succinate (TOPROL-XL) 50 MG 24  hr tablet Take 50 mg by mouth at bedtime. Take with or immediately following a meal.   Yes [provider]  Multiple Vitamins-Minerals (MULTIVITAMIN ADULT PO) Take 1 tablet by mouth daily.    Yes [provider]  Omega-3 Fatty Acids (FISH OIL) 1200 MG CAPS TAKE 2 CAPSULES BY MOUTH TWICE A DAY   Yes [provider]  trimethoprim (TRIMPEX) 100 MG tablet Take 100 mg by mouth daily. 09/01/17  Yes [provider]    Family History Family History  Problem Relation Age of Onset  . Heart attack Mother 4060       CABG  . Heart disease  Father        PACEMAKER  . Heart attack Daughter 728       BLOCKED LAD    Social History Social History   Tobacco Use  . Smoking status: Never Smoker  . Smokeless tobacco: Never Used  Substance Use Topics  . Alcohol use: No  . Drug use: No     Allergies   Iodine; Shellfish allergy; and Tamiflu [oseltamivir phosphate]   Review of Systems Review of Systems  Cardiovascular: Positive for chest pain.  All other systems reviewed and are negative.    Physical Exam Updated Vital Signs BP (!) 155/86 (BP Location: Left Arm)   Pulse 72   Temp 98.7 F (37.1 C) (Oral)   Resp 15   SpO2 100%   Physical Exam  Constitutional: She is oriented to person, place, and time. She appears well-developed and well-nourished. No distress.  HENT:  Head: Normocephalic and atraumatic.  Mouth/Throat: Oropharynx is clear and moist.  Eyes: Conjunctivae and EOM are normal. Pupils are equal, round, and reactive to light.  Neck: Normal range of motion. Neck supple.  Cardiovascular: Normal rate, regular rhythm and normal heart sounds.  Pulmonary/Chest: Effort normal and breath sounds normal. No respiratory distress.  Abdominal: Soft. She exhibits no distension. There is no tenderness.  Musculoskeletal: Normal range of motion. She exhibits no edema or deformity.  Neurological: She is alert and oriented to person, place, and time.  Skin: Skin is warm and dry.  Psychiatric: She has a normal mood and affect.  Nursing note and vitals reviewed.    ED Treatments / Results  Labs (all labs ordered are listed, but only abnormal results are displayed) Labs Reviewed  BASIC METABOLIC PANEL - Abnormal; Notable for the following components:      Result Value   Glucose, Bld 104 (*)    All other components within normal limits  I-STAT BETA HCG BLOOD, ED ([facility], [facility], [facility] ONLY) - Abnormal; Notable for the following components:   I-stat hCG, quantitative 6.4 (*)    All other components within normal limits  CBC    I-STAT TROPONIN, ED  I-STAT TROPONIN, ED    EKG  EKG Interpretation  Date/Time:  Monday September 15 2017 17:59:09 EDT Ventricular Rate:  82 PR Interval:    QRS Duration: 89 QT Interval:  364 QTC Calculation: 426 R Axis:   60 Text Interpretation:  Sinus rhythm Minimal ST depression, lateral leads Confirmed by Kristine RoyalMessick, Peter [phone]([id number]) on 09/15/2017 9:23:57 PM       Radiology Dg Chest 2 View  Result Date: 09/15/2017 CLINICAL DATA:  Chest pain EXAM: CHEST - 2 VIEW COMPARISON:  CT 08/29/2017, radiograph 07/11/2016 FINDINGS: Surgical changes in the cervical spine. No acute consolidation or effusion. Normal heart size. No pneumothorax. IMPRESSION: No active cardiopulmonary disease. Electronically Signed   By: Jasmine PangKim  Fujinaga  M.D.   On: 09/15/2017 18:59    Procedures Procedures (including critical care time)  Medications Ordered in ED Medications  ketorolac (TORADOL) 30 MG/ML injection 30 mg (not administered)  gi cocktail (Maalox,Lidocaine,Donnatal) (not administered)     Initial Impression / Assessment and Plan / ED Course  I have reviewed the triage vital signs and the nursing notes.  Pertinent labs & imaging results that were available during my care of the patient were reviewed by me and considered in my medical decision making (see chart for details).     MDM  Screen complete  Patient is presenting with atypical chest discomfort. EKG is without findings suggestive of acute ischemia. Trop x 2 is negative. Other screening labs performed in the ED are without acute findings. She feels improved following Toradol and GI cocktail. Patient has previously arranged follow up with her cardiologist tomorrow. She desires DC home.  Strict return precautions given and understood.     Final Clinical Impressions(s) / ED Diagnoses   Final diagnoses:  Chest pain, unspecified type    ED Discharge Orders        Ordered    famotidine (PEPCID) 20 MG tablet  2 times daily     09/15/17  2302       Wynetta Fines, MD 09/15/17 2304

## 2017-09-16 ENCOUNTER — Encounter: Payer: Self-pay | Admitting: Interventional Cardiology

## 2017-09-16 ENCOUNTER — Ambulatory Visit (INDEPENDENT_AMBULATORY_CARE_PROVIDER_SITE_OTHER): Payer: 59 | Admitting: Interventional Cardiology

## 2017-09-16 VITALS — BP 126/82 | HR 72 | Ht 68.0 in | Wt 177.0 lb

## 2017-09-16 DIAGNOSIS — I2510 Atherosclerotic heart disease of native coronary artery without angina pectoris: Secondary | ICD-10-CM

## 2017-09-16 DIAGNOSIS — I10 Essential (primary) hypertension: Secondary | ICD-10-CM

## 2017-09-16 DIAGNOSIS — R0782 Intercostal pain: Secondary | ICD-10-CM | POA: Diagnosis not present

## 2017-09-16 DIAGNOSIS — E782 Mixed hyperlipidemia: Secondary | ICD-10-CM

## 2017-09-16 NOTE — Patient Instructions (Signed)
Medication Instructions:  Your physician recommends that you continue on your current medications as directed. Please refer to the Current Medication list given to you today.   Labwork: None ordered  Testing/Procedures: Your physician has requested that you have an exercise tolerance test. For further information please visit [email or web address] Please also follow instruction sheet, as given.  Follow-Up: Based on test results   Any Other Special Instructions Will Be Listed Below (If Applicable).   Exercise Stress Electrocardiogram An exercise stress electrocardiogram is a test to check how blood flows to your heart. It is done to find areas of poor blood flow. You will need to walk on a treadmill for this test. The electrocardiogram will record your heartbeat when you are at rest and when you are exercising. What happens before the procedure?  Do not have drinks with caffeine or foods with caffeine for 24 hours before the test, or as told by your doctor. This includes coffee, tea (even decaf tea), sodas, chocolate, and cocoa.  Follow your doctor's instructions about eating and drinking before the test.  Ask your doctor what medicines you should or should not take before the test. Take your medicines with water unless told by your doctor not to.  If you use an inhaler, bring it with you to the test.  Bring a snack to eat after the test.  Do not  smoke for 4 hours before the test.  Do not put lotions, powders, creams, or oils on your chest before the test.  Wear comfortable shoes and clothing. What happens during the procedure?  You will have patches put on your chest. Small areas of your chest may need to be shaved. Wires will be connected to the patches.  Your heart rate will be watched while you are resting and while you are exercising.  You will walk on the treadmill. The treadmill will slowly get faster to raise your heart rate.  The test will take about 1-2  hours. What happens after the procedure?  Your heart rate and blood pressure will be watched after the test.  You may return to your normal diet, activities, and medicines or as told by your doctor. This information is not intended to replace advice given to you by your health care provider. Make sure you discuss any questions you have with your health care provider. Document Released: 12/04/2007 Document Revised: 02/14/2016 Document Reviewed: 02/22/2013 Elsevier Interactive Patient Education  2018 [employer].   Heart-Healthy Eating Plan Heart-healthy meal planning includes:  Limiting unhealthy fats.  Increasing healthy fats.  Making other small dietary changes.  You may need to talk with your doctor or a diet specialist (dietitian) to create an eating plan that is right for you. What types of fat should I choose?  Choose healthy fats. These include olive oil and canola oil, flaxseeds, walnuts, almonds, and seeds.  Eat more omega-3 fats. These include salmon, mackerel, sardines, tuna, flaxseed oil, and ground flaxseeds. Try to eat fish at least twice each week.  Limit saturated fats. ? Saturated fats are often found in animal products, such as meats, butter, and cream. ? Plant sources of saturated fats include palm oil, palm kernel oil, and coconut oil.  Avoid foods with partially hydrogenated oils in them. These include stick margarine, some tub margarines, cookies, crackers, and other baked goods. These contain trans fats. What general guidelines do I need to follow?  Check food labels carefully. Identify foods with trans fats or high amounts of saturated fat.  Fill  one half of your plate with vegetables and green salads. Eat 4-5 servings of vegetables per day. A serving of vegetables is: ? 1 cup of raw leafy vegetables. ?  cup of raw or cooked cut-up vegetables. ?  cup of vegetable juice.  Fill one fourth of your plate with whole grains. Look for the word "whole" as  the first word in the ingredient list.  Fill one fourth of your plate with lean protein foods.  Eat 4-5 servings of fruit per day. A serving of fruit is: ? One medium whole fruit. ?  cup of dried fruit. ?  cup of fresh, frozen, or canned fruit. ?  cup of 100% fruit juice.  Eat more foods that contain soluble fiber. These include apples, broccoli, carrots, beans, peas, and barley. Try to get 20-30 g of fiber per day.  Eat more home-cooked food. Eat less restaurant, buffet, and fast food.  Limit or avoid alcohol.  Limit foods high in starch and sugar.  Avoid fried foods.  Avoid frying your food. Try baking, boiling, grilling, or broiling it instead. You can also reduce fat by: ? Removing the skin from poultry. ? Removing all visible fats from meats. ? Skimming the fat off of stews, soups, and gravies before serving them. ? Steaming vegetables in water or broth.  Lose weight if you are overweight.  Eat 4-5 servings of nuts, legumes, and seeds per week: ? One serving of dried beans or legumes equals  cup after being cooked. ? One serving of nuts equals 1 ounces. ? One serving of seeds equals  ounce or one tablespoon.  You may need to keep track of how much salt or sodium you eat. This is especially true if you have high blood pressure. Talk with your doctor or dietitian to get more information. What foods can I eat? Grains Breads, including [place], white, pita, wheat, raisin, rye, oatmeal, and [place]. Tortillas that are neither fried nor made with lard or trans fat. Low-fat rolls, including hotdog and hamburger buns and English muffins. Biscuits. Muffins. Waffles. Pancakes. Light popcorn. Whole-grain cereals. Flatbread. Melba toast. Pretzels. Breadsticks. Rusks. Low-fat snacks. Low-fat crackers, including oyster, saltine, matzo, graham, animal, and rye. Rice and pasta, including brown rice and pastas that are made with whole wheat. Vegetables All vegetables. Fruits All  fruits, but limit coconut. Meats and Other Protein Sources Lean, well-trimmed beef, veal, pork, and lamb. Chicken and [place] without skin. All fish and shellfish. Wild duck, rabbit, pheasant, and venison. Egg whites or low-cholesterol egg substitutes. Dried beans, peas, lentils, and tofu. Seeds and most nuts. Dairy Low-fat or nonfat cheeses, including ricotta, string, and mozzarella. Skim or 1% milk that is liquid, powdered, or evaporated. Buttermilk that is made with low-fat milk. Nonfat or low-fat yogurt. Beverages Mineral water. Diet carbonated beverages. Sweets and Desserts Sherbets and fruit ices. Honey, jam, marmalade, jelly, and syrups. Meringues and gelatins. Pure sugar candy, such as hard candy, jelly beans, gumdrops, mints, marshmallows, and small amounts of dark chocolate. [employer]. Eat all sweets and desserts in moderation. Fats and Oils Nonhydrogenated (trans-free) margarines. Vegetable oils, including soybean, sesame, sunflower, olive, peanut, safflower, corn, canola, and cottonseed. Salad dressings or mayonnaise made with a vegetable oil. Limit added fats and oils that you use for cooking, baking, salads, and as spreads. Other Cocoa powder. Coffee and tea. All seasonings and condiments. The items listed above may not be a complete list of recommended foods or beverages. Contact your dietitian for more options. What  foods are not recommended? Grains Breads that are made with saturated or trans fats, oils, or whole milk. Croissants. Butter rolls. Cheese breads. Sweet rolls. Donuts. Buttered popcorn. Chow mein noodles. High-fat crackers, such as cheese or butter crackers. Meats and Other Protein Sources Fatty meats, such as hotdogs, short ribs, sausage, spareribs, bacon, rib eye roast or steak, and mutton. High-fat deli meats, such as salami and bologna. Caviar. Domestic duck and goose. Organ meats, such as kidney, liver, sweetbreads, and heart. Dairy Cream, sour cream, cream  cheese, and creamed cottage cheese. Whole-milk cheeses, including blue (bleu), [address] [place], [place], [place], [address], [place], [address], cheddar, [place], and [place]. Whole or 2% milk that is liquid, evaporated, or condensed. Whole buttermilk. Cream sauce or high-fat cheese sauce. Yogurt that is made from whole milk. Beverages Regular sodas and juice drinks with added sugar. Sweets and Desserts Frosting. Pudding. Cookies. Cakes other than angel food cake. Candy that has milk chocolate or white chocolate, hydrogenated fat, butter, coconut, or unknown ingredients. Buttered syrups. Full-fat ice cream or ice cream drinks. Fats and Oils Gravy that has suet, meat fat, or shortening. Cocoa butter, hydrogenated oils, palm oil, coconut oil, palm kernel oil. These can often be found in baked products, candy, fried foods, nondairy creamers, and whipped toppings. Solid fats and shortenings, including bacon fat, salt pork, lard, and butter. Nondairy cream substitutes, such as coffee creamers and sour cream substitutes. Salad dressings that are made of unknown oils, cheese, or sour cream. The items listed above may not be a complete list of foods and beverages to avoid. Contact your dietitian for more information. This information is not intended to replace advice given to you by your health care provider. Make sure you discuss any questions you have with your health care provider. Document Released: 12/17/2011 Document Revised: 11/23/2015 Document Reviewed: 12/09/2013 Elsevier Interactive Patient Education  [employer]2018 Elsevier Inc.   If you need a refill on your cardiac medications before your next appointment, please call your pharmacy.

## 2017-09-16 NOTE — Progress Notes (Signed)
Cardiology Office Note   Date:  09/16/2017   ID:  Christine Nolan, DOB 1966-05-20, MRN [id number]  PCP:  Lupita Raider, MD    No chief complaint on file.  Coronary calcification  Wt Readings from Last 3 Encounters:  09/16/17 177 lb (80.3 kg)  01/13/17 173 lb 12.8 oz (78.8 kg)       History of Present Illness: Christine Nolan is a 52 y.o. female  Who had a CT scan in 2018 which showed atherosclerosis in the aorta and coronary arteries, with aortic valve calcification.  She has family h/o CAD.  Her daughter recently had SCAD.    Father with AFib and leaky valve.  Mother had CAD in her mid 31s with CABG.  She was overweight.   She had some GERD sx in 3/18 prompting a treadmill.  She was stressed at the time as well.   She had a stress test in 3/18:  The patient walked for 6:30 of a standard Bruce protocol. She achieved a peak HR of 148 which is 88% predicted maximal HR .  There was insignificant nonspecific ST depression in the lateral leads which resolved quickly in the recovery phase. These ST changes were likely due to to the hypertensive response .  Blood pressure demonstrated a hypertensive response to exercise. Negative GXT  She was on Lexapro for a little while but stopped it.  She occasionally takes Ativan.   She was seen in the ER on 09/15/17: "Patient is presenting with atypical chest discomfort. EKG is without findings suggestive of acute ischemia. Trop x 2 is negative. Other screening labs performed in the ED are without acute findings. She feels improved following Toradol and GI cocktail. Patient has previously arranged follow up with her cardiologist tomorrow. She desires DC home.  Strict return precautions given and understood. "  She has had several days of chest pain.  Started in the left side of neck.  She noted some swelling there.  Started to move down into the left side of her chest and under her left breast.  It waxed and waned for several days but never  fully went away.  Negative w/u in the ER.  She tried some Ativan as well without full relief.   She walks a little, not much.  No problems when she walks.        Past Medical History:  Diagnosis Date  . CAD (coronary artery disease)   . Fatty liver   . Flank pain   . Hematuria, unspecified   . Hyperlipidemia   . Hypertension   . SOB (shortness of breath)   . UTI (urinary tract infection)     Past Surgical History:  Procedure Laterality Date  . btl    . CERVICAL DISC ARTHROPLASTY    . CYST REMOVAL HAND    . KNEE SURGERY Left    knee repair  . left foot surgery    . WISDOM TOOTH EXTRACTION       Current Outpatient Medications  Medication Sig Dispense Refill  . acetaminophen (TYLENOL) 500 MG tablet Take 500 mg by mouth every 6 (six) hours as needed for moderate pain.    Marland Kitchen atorvastatin (LIPITOR) 20 MG tablet Take 20 mg by mouth daily.    . Calcium Carbonate-Vitamin D3 (CALCIUM 600/VITAMIN D) 600-400 MG-UNIT TABS Take 1 tablet by mouth daily.     Marland Kitchen dimenhyDRINATE (DRAMAMINE) 50 MG tablet Take 50 mg by mouth every 8 (eight) hours as needed for  dizziness.    Marland Kitchen. estradiol (ESTRACE) 2 MG tablet Take 2 mg by mouth daily.  3  . famotidine (PEPCID) 20 MG tablet Take 1 tablet (20 mg total) by mouth 2 (two) times daily. 60 tablet 0  . LORazepam (ATIVAN) 0.5 MG tablet TAKE 1/2 TO 1 TABLET BY MOUTH TWICE A DAY AS NEEDED FOR ANXIETY    . medroxyPROGESTERone (PROVERA) 2.5 MG tablet Take 2.5 mg by mouth daily.  3  . metoprolol succinate (TOPROL-XL) 50 MG 24 hr tablet Take 50 mg by mouth at bedtime. Take with or immediately following a meal.    . Multiple Vitamins-Minerals (MULTIVITAMIN ADULT PO) Take 1 tablet by mouth daily.     . Omega-3 Fatty Acids (FISH OIL) 1200 MG CAPS TAKE 2 CAPSULES BY MOUTH TWICE A DAY    . trimethoprim (TRIMPEX) 100 MG tablet Take 100 mg by mouth daily.  11   No current facility-administered medications for this visit.     Allergies:   Iodine; Shellfish  allergy; and Tamiflu [oseltamivir phosphate]    Social History:  The patient  reports that  has never smoked. she has never used smokeless tobacco. She reports that she does not drink alcohol or use drugs.   Family History:  The patient's family history includes Heart attack (age of onset: 1028) in her daughter; Heart attack (age of onset: 4960) in her mother; Heart disease in her father.    ROS:  Please see the history of present illness.   Otherwise, review of systems are positive for chest pain.   All other systems are reviewed and negative.    PHYSICAL EXAM: VS:  BP 126/82   Pulse 72   Ht 5\' 8"  (1.727 m)   Wt 177 lb (80.3 kg)   SpO2 97%   BMI 26.91 kg/m  , BMI Body mass index is 26.91 kg/m. GEN: Well nourished, well developed, in no acute distress  HEENT: Base of left neck is more swollen Neck: no JVD, carotid bruits, or masses Cardiac: RRR; no murmurs, rubs, or gallops,no edema  Respiratory:  clear to auscultation bilaterally, normal work of breathing GI: soft, nontender, nondistended, + BS MS: no deformity or atrophy  Skin: warm and dry, no rash Neuro:  Strength and sensation are intact Psych: euthymic mood, full affect   EKG:   The ekg ordered yesterday demonstrates normal sinus rhythm, no ST segment changes   Recent Labs: 09/15/2017: BUN 17; Creatinine, Ser 0.98; Hemoglobin 14.7; Platelets 252; Potassium 3.6; Sodium 140   Lipid Panel No results found for: CHOL, TRIG, HDL, CHOLHDL, VLDL, LDLCALC, LDLDIRECT   Other studies Reviewed: Additional studies/ records that were reviewed today with results demonstrating: Home blood pressure readings reviewed.  ER records reviewed..   ASSESSMENT AND PLAN:  1. Coronary calcifications: Coronary CT confirmed high percentile calcium score.  Atypical chest pain at this time.  Negative w/u in ER.  Plan for repeat ETT.  Given how long the pain is lasted and the fact that her troponins are negative, this is  reassuring. 2. Hyperlipidemia: Continue atorvastatin.  LDL at target.  Triglycerides are mildly elevated.  Asked her to decrease sugar intake and fried food intake. 3. HTN: The current medical regimen is effective;  continue present plan and medications.     Current medicines are reviewed at length with the patient today.  The patient concerns regarding her medicines were addressed.  The following changes have been made:  No change  Labs/ tests ordered today include:  No orders of the defined types were placed in this encounter.   Recommend 150 minutes/week of aerobic exercise Low fat, low carb, high fiber diet recommended  Disposition:   FU for ETT   Signed, Lance Muss, MD  09/16/2017 2:54 PM    [facility] [address] [place], [place], [place]  [address] Phone: [phone]; Fax: [phone]

## 2017-09-22 ENCOUNTER — Other Ambulatory Visit: Payer: Self-pay | Admitting: Family Medicine

## 2017-09-22 DIAGNOSIS — E010 Iodine-deficiency related diffuse (endemic) goiter: Secondary | ICD-10-CM | POA: Diagnosis not present

## 2017-09-22 DIAGNOSIS — R072 Precordial pain: Secondary | ICD-10-CM | POA: Diagnosis not present

## 2017-09-24 ENCOUNTER — Ambulatory Visit: Payer: Self-pay | Admitting: Interventional Cardiology

## 2017-09-25 ENCOUNTER — Ambulatory Visit
Admission: RE | Admit: 2017-09-25 | Discharge: 2017-09-25 | Disposition: A | Discharge status: Home / Self Care | Payer: 59 | Source: Ambulatory Visit | Attending: Family Medicine | Admitting: Family Medicine

## 2017-09-25 DIAGNOSIS — E010 Iodine-deficiency related diffuse (endemic) goiter: Secondary | ICD-10-CM

## 2017-09-25 DIAGNOSIS — E041 Nontoxic single thyroid nodule: Secondary | ICD-10-CM | POA: Diagnosis not present

## 2017-09-26 ENCOUNTER — Ambulatory Visit (INDEPENDENT_AMBULATORY_CARE_PROVIDER_SITE_OTHER): Payer: 59

## 2017-09-26 DIAGNOSIS — R0782 Intercostal pain: Secondary | ICD-10-CM

## 2017-09-26 LAB — EXERCISE TOLERANCE TEST
CHL CUP MPHR: 167 {beats}/min
CHL CUP RESTING HR STRESS: 74 {beats}/min
CSEPED: 7 min
Estimated workload: 8.6 METS
Exercise duration (sec): 4 s
Peak HR: 151 {beats}/min
Percent HR: 90 %
RPE: 17

## 2017-09-29 ENCOUNTER — Telehealth: Payer: Self-pay | Admitting: Interventional Cardiology

## 2017-09-29 NOTE — Telephone Encounter (Signed)
New Message ° ° °Patient is returning call in reference to stress test results. Please call to discuss.  °

## 2017-09-29 NOTE — Telephone Encounter (Signed)
-----   Message from Corky CraftsJayadeep S Varanasi, MD sent at 09/27/2017  4:50 PM EDT ----- Negative ETT.

## 2017-09-29 NOTE — Telephone Encounter (Signed)
Returned call to patient. Patient states that she received the message about her stress test. Patient states that she understands that her she had a negative stress test. Patient states that she has questions about this since her coronary calcium score was 429 and she was in the 99th percentile. Patient states that she is still concerned since she has a strong family history and wants to know if further testing is needed. Made patient aware that since she did not experience any physical symptoms during the test and she did not have any ST segment deviation or abnormal EKG changes noted during the test that this meant that there was not any indications of ischemia or severe narrowing or blockages of her arteries at this time and that her heart was getting adequate blood flow. Made patient aware that this was reassuring and that for now we will continue to monitor. Instructed patient she would need to let us know if her symptoms changed or worsened and we could reassess to see if further testing was warranted. Patient verbalized understanding and thanked me for the call.

## 2017-11-19 DIAGNOSIS — M65312 Trigger thumb, left thumb: Secondary | ICD-10-CM | POA: Diagnosis not present

## 2017-11-19 DIAGNOSIS — G5601 Carpal tunnel syndrome, right upper limb: Secondary | ICD-10-CM | POA: Diagnosis not present

## 2017-12-17 DIAGNOSIS — M65312 Trigger thumb, left thumb: Secondary | ICD-10-CM | POA: Diagnosis not present

## 2017-12-17 DIAGNOSIS — G5601 Carpal tunnel syndrome, right upper limb: Secondary | ICD-10-CM | POA: Diagnosis not present

## 2018-03-24 DIAGNOSIS — Z23 Encounter for immunization: Secondary | ICD-10-CM | POA: Diagnosis not present

## 2018-04-20 ENCOUNTER — Ambulatory Visit: Payer: Self-pay | Admitting: Interventional Cardiology

## 2018-04-30 ENCOUNTER — Encounter

## 2018-06-12 DIAGNOSIS — R05 Cough: Secondary | ICD-10-CM | POA: Diagnosis not present

## 2018-06-12 DIAGNOSIS — B349 Viral infection, unspecified: Secondary | ICD-10-CM | POA: Diagnosis not present

## 2018-06-30 DIAGNOSIS — I10 Essential (primary) hypertension: Secondary | ICD-10-CM | POA: Diagnosis not present

## 2018-06-30 DIAGNOSIS — R202 Paresthesia of skin: Secondary | ICD-10-CM | POA: Diagnosis not present

## 2018-06-30 DIAGNOSIS — E782 Mixed hyperlipidemia: Secondary | ICD-10-CM | POA: Diagnosis not present

## 2018-06-30 DIAGNOSIS — Z0000 Encounter for general adult medical examination without abnormal findings: Secondary | ICD-10-CM | POA: Diagnosis not present

## 2018-07-06 DIAGNOSIS — Z01419 Encounter for gynecological examination (general) (routine) without abnormal findings: Secondary | ICD-10-CM | POA: Diagnosis not present

## 2018-07-06 DIAGNOSIS — Z124 Encounter for screening for malignant neoplasm of cervix: Secondary | ICD-10-CM | POA: Diagnosis not present

## 2018-07-06 DIAGNOSIS — Z6824 Body mass index (BMI) 24.0-24.9, adult: Secondary | ICD-10-CM | POA: Diagnosis not present

## 2018-07-06 DIAGNOSIS — Z1231 Encounter for screening mammogram for malignant neoplasm of breast: Secondary | ICD-10-CM | POA: Diagnosis not present

## 2018-07-20 DIAGNOSIS — J0100 Acute maxillary sinusitis, unspecified: Secondary | ICD-10-CM | POA: Diagnosis not present

## 2018-07-20 DIAGNOSIS — J209 Acute bronchitis, unspecified: Secondary | ICD-10-CM | POA: Diagnosis not present

## 2018-07-22 DIAGNOSIS — J45909 Unspecified asthma, uncomplicated: Secondary | ICD-10-CM | POA: Diagnosis not present

## 2018-07-22 DIAGNOSIS — J209 Acute bronchitis, unspecified: Secondary | ICD-10-CM | POA: Diagnosis not present

## 2018-09-01 ENCOUNTER — Encounter: Payer: Self-pay | Admitting: Interventional Cardiology

## 2018-09-01 ENCOUNTER — Ambulatory Visit: Payer: BLUE CROSS/BLUE SHIELD | Admitting: Interventional Cardiology

## 2018-09-01 VITALS — BP 146/90 | HR 75 | Ht 68.0 in | Wt 165.4 lb

## 2018-09-01 DIAGNOSIS — I10 Essential (primary) hypertension: Secondary | ICD-10-CM

## 2018-09-01 DIAGNOSIS — I2510 Atherosclerotic heart disease of native coronary artery without angina pectoris: Secondary | ICD-10-CM

## 2018-09-01 DIAGNOSIS — E782 Mixed hyperlipidemia: Secondary | ICD-10-CM | POA: Diagnosis not present

## 2018-09-01 NOTE — Patient Instructions (Signed)

## 2018-09-01 NOTE — Progress Notes (Signed)
Cardiology Office Note   Date:  09/01/2018   ID:  Christine Nolan, DOB [birth year], MRN [id number]  PCP:  Lupita Raider, MD    No chief complaint on file.  Coronary calcium  Wt Readings from Last 3 Encounters:  09/01/18 165 lb 6.4 oz (75 kg)  09/16/17 177 lb (80.3 kg)  01/13/17 173 lb 12.8 oz (78.8 kg)       History of Present Illness: Christine Nolan is a 53 y.o. female  Who had a CT scan in 2018 which showed atherosclerosis in the aorta and coronary arteries, with aortic valve calcification. She has family h/o CAD. Her daughter recently had SCAD.   Father with AFib and leaky valve. Mother had CAD in her mid 88s with CABG. She was overweight.   She had some GERD sx in 3/18 prompting a treadmill. She was stressed at the time as well.   She had a stress test in 3/18:  The patient walked for 6:30 of a standard Bruce protocol. She achieved a peak HR of 148 which is 88% predicted maximal HR .  There was insignificant nonspecific ST depression in the lateral leads which resolved quickly in the recovery phase. These ST changes were likely due to to the hypertensive response .  Blood pressure demonstrated a hypertensive response to exercise. Negative GXT  Repeat GXT in 2019 was negative.  She has been on Lexapro and Ativan for anxiety.   Her company changed from [employer] to [employer].   She has occasional pressure in her chest at random.  She walks at work and has no discomfort with that activity.    Denies :  Dizziness. Leg edema. Nitroglycerin use. Orthopnea. Palpitations. Paroxysmal nocturnal dyspnea. Shortness of breath. Syncope.   No exertional chest pain.   BP at home in the 120s/80s typically.  She following her BP closely at home.     Past Medical History:  Diagnosis Date  . CAD (coronary artery disease)   . Fatty liver   . Flank pain   . Hematuria, unspecified   . Hyperlipidemia   . Hypertension   . SOB (shortness of breath)   . UTI (urinary tract  infection)     Past Surgical History:  Procedure Laterality Date  . btl    . CERVICAL DISC ARTHROPLASTY    . CYST REMOVAL HAND    . KNEE SURGERY Left    knee repair  . left foot surgery    . WISDOM TOOTH EXTRACTION       Current Outpatient Medications  Medication Sig Dispense Refill  . Calcium Carbonate-Vitamin D3 (CALCIUM 600/VITAMIN D) 600-400 MG-UNIT TABS Take 1 tablet by mouth daily.     Marland Kitchen dimenhyDRINATE (DRAMAMINE) 50 MG tablet Take 50 mg by mouth every 8 (eight) hours as needed for dizziness.    Marland Kitchen estradiol (ESTRACE) 2 MG tablet Take 2 mg by mouth daily.  3  . LORazepam (ATIVAN) 0.5 MG tablet TAKE 1/2 TO 1 TABLET BY MOUTH TWICE A DAY AS NEEDED FOR ANXIETY    . medroxyPROGESTERone (PROVERA) 2.5 MG tablet Take 2.5 mg by mouth daily.  3  . metoprolol succinate (TOPROL-XL) 50 MG 24 hr tablet Take 50 mg by mouth at bedtime. Take with or immediately following a meal.    . Multiple Vitamins-Minerals (MULTIVITAMIN ADULT PO) Take 1 tablet by mouth daily.     . Omega-3 Fatty Acids (FISH OIL) 1200 MG CAPS TAKE 2 CAPSULES BY MOUTH TWICE A DAY    .  Pitavastatin Calcium (LIVALO) 4 MG TABS Take 4 mg by mouth daily.    Marland Kitchen trimethoprim (TRIMPEX) 100 MG tablet Take 100 mg by mouth daily.  11   No current facility-administered medications for this visit.     Allergies:   Iodine; Shellfish allergy; and Tamiflu [oseltamivir phosphate]    Social History:  The patient  reports that she has never smoked. She has never used smokeless tobacco. She reports that she does not drink alcohol or use drugs.   Family History:  The patient's family history includes Heart attack (age of onset: 21) in her daughter; Heart attack (age of onset: 68) in her mother; Heart disease in her father.    ROS:  Please see the history of present illness.   Otherwise, review of systems are positive for anxiety; intentional weight loss.   All other systems are reviewed and negative.    PHYSICAL EXAM: VS:  BP (!) 146/90    Pulse 75   Ht 5\' 8"  (1.727 m)   Wt 165 lb 6.4 oz (75 kg)   SpO2 99%   BMI 25.15 kg/m  , BMI Body mass index is 25.15 kg/m. GEN: Well nourished, well developed, in no acute distress  HEENT: normal  Neck: no JVD, carotid bruits, or masses Cardiac: RRR; no murmurs, rubs, or gallops,no edema  Respiratory:  clear to auscultation bilaterally, normal work of breathing GI: soft, nontender, nondistended, + BS MS: no deformity or atrophy  Skin: warm and dry, no rash Neuro:  Strength and sensation are intact Psych: euthymic mood, full affect   EKG:   The ekg ordered today demonstrates NSR, no ST changes   Recent Labs: 09/15/2017: BUN 17; Creatinine, Ser 0.98; Hemoglobin 14.7; Platelets 252; Potassium 3.6; Sodium 140   Lipid Panel No results found for: CHOL, TRIG, HDL, CHOLHDL, VLDL, LDLCALC, LDLDIRECT   Other studies Reviewed: Additional studies/ records that were reviewed today with results demonstrating: Coronary CT results reviewed and .   ASSESSMENT AND PLAN:  1. Coronary calcium: 99%ile.  She has some atypical symptoms.  I think they are more related to stress or some type of muscular strain.  They do not sound anginal in nature.  Now on livalo.  She was on atorvastatin but this was stopped due to foot pain.  No real difference in her foot pain.  She may have plantar fasciitis.  She will have her lipids rechecked with her primary care doctor.  I think it is okay for her to go back to atorvastatin since there has been no significant difference in her foot pain.  Lipids were well controlled in December 2019 on the atorvastatin.  She will have these rechecked with her primary care doctor. 2. Mixed hyperlipidemia: TG elevated.  Continue to avoid fried foods and also foods with added sugars. 3. Increased BP: Continue checking at home. Controlled on metoprolol.   Current medicines are reviewed at length with the patient today.  The patient concerns regarding her medicines were  addressed.  The following changes have been made:  No change  Labs/ tests ordered today include:  No orders of the defined types were placed in this encounter.   Recommend 150 minutes/week of aerobic exercise Low fat, low carb, high fiber diet recommended  Disposition:   FU in 1 year   Signed, Lance Muss, MD  09/01/2018 9:40 AM    [facility] [address] [place], [place], [place]  [address] Phone: [phone]; Fax: [phone]

## 2018-09-09 DIAGNOSIS — N390 Urinary tract infection, site not specified: Secondary | ICD-10-CM | POA: Diagnosis not present

## 2018-09-09 DIAGNOSIS — R351 Nocturia: Secondary | ICD-10-CM | POA: Diagnosis not present

## 2018-11-02 DIAGNOSIS — I10 Essential (primary) hypertension: Secondary | ICD-10-CM | POA: Diagnosis not present

## 2018-11-05 ENCOUNTER — Telehealth: Payer: Self-pay | Admitting: Interventional Cardiology

## 2018-11-05 NOTE — Telephone Encounter (Signed)
Called and spoke to patient. She states that she had her cholesterol checked with PCP on 5/4 and wants Dr. Hoyle Barr recommendations. Labs in [place] are as follows:  Total- 192 HDL- 60 LDL- 99 TG- 163  Patient was on atorvastatin 20 mg QD and lipids were well controlled in December. She switched to livalo 4mg  QD in February due to foot pain, but there has not been any improvement. Will forward to Dr. Eldridge Dace for review and recommendation on whether she should go back to taking atorvastatin 20 mg QD. Patient was also educated on avoiding fried foods, carbs, and sugars in her diet.

## 2018-11-05 NOTE — Telephone Encounter (Signed)
  Patient did blood work at [facility] to check [facility] cholesterol and the PCP was to send [facility] results for Dr Eldridge Dace to review. The PCP wants Eldridge Dace to give recommendation for Christine [facility] medication so they will know what to write the prescription for Christine [facility] cholesterol. Please advise.

## 2018-11-06 NOTE — Telephone Encounter (Signed)
Called and made patient aware of Dr. Hoyle Barr recommendation to switch the livalo back to atorvastatin 20 mg QD. Patient states that she is going to call her PCP and let them know. She states that she wants them to be the prescriber. Will update her med list and send information to Dr. Alver Fisher office.

## 2018-11-06 NOTE — Telephone Encounter (Signed)
If there was no change in foot pain on Livalo, would go back to atorvastatin 20 mg daily.

## 2018-11-20 ENCOUNTER — Encounter: Payer: Self-pay | Admitting: Podiatry

## 2018-11-20 ENCOUNTER — Other Ambulatory Visit: Payer: Self-pay | Admitting: Podiatry

## 2018-11-20 ENCOUNTER — Other Ambulatory Visit: Payer: Self-pay

## 2018-11-20 ENCOUNTER — Ambulatory Visit (INDEPENDENT_AMBULATORY_CARE_PROVIDER_SITE_OTHER): Payer: BLUE CROSS/BLUE SHIELD

## 2018-11-20 ENCOUNTER — Ambulatory Visit: Payer: BLUE CROSS/BLUE SHIELD | Admitting: Podiatry

## 2018-11-20 VITALS — BP 132/72 | HR 70 | Temp 97.7°F

## 2018-11-20 DIAGNOSIS — M79672 Pain in left foot: Secondary | ICD-10-CM

## 2018-11-20 DIAGNOSIS — M2041 Other hammer toe(s) (acquired), right foot: Secondary | ICD-10-CM

## 2018-11-20 DIAGNOSIS — M2042 Other hammer toe(s) (acquired), left foot: Secondary | ICD-10-CM

## 2018-11-20 DIAGNOSIS — M779 Enthesopathy, unspecified: Secondary | ICD-10-CM

## 2018-11-20 DIAGNOSIS — M79671 Pain in right foot: Secondary | ICD-10-CM

## 2018-11-20 DIAGNOSIS — M7751 Other enthesopathy of right foot: Secondary | ICD-10-CM | POA: Diagnosis not present

## 2018-11-20 DIAGNOSIS — IMO0002 Reserved for concepts with insufficient information to code with codable children: Secondary | ICD-10-CM | POA: Insufficient documentation

## 2018-11-20 MED ORDER — DICLOFENAC SODIUM 75 MG PO TBEC: 75.0000 mg | DELAYED_RELEASE_TABLET | Freq: Two times a day (BID) | ORAL | 2 refills | Status: DC

## 2018-11-20 MED ORDER — TRIAMCINOLONE ACETONIDE 10 MG/ML IJ SUSP
10.0000 mg | Freq: Once | INTRAMUSCULAR | Status: AC
  Administered 2018-11-20: 10 mg

## 2018-11-20 NOTE — Progress Notes (Signed)
Subjective:   Patient ID: Christine Nolan, female   DOB: 53 y.o.   MRN: [id number]   HPI Patient presents stating she is developed a lot of pain over the last year and her forefoot right and then has started developed some discomfort into the arch and ankle right over left.  Also the left hurts but not to the same degree and patient does feel like her arch collapses when she walks.  Patient does not smoke and likes to be active   Review of Systems  All other systems reviewed and are negative.       Objective:  Physical Exam Vitals signs and nursing note reviewed.  Constitutional:      Appearance: She is well-developed.  Pulmonary:     Effort: Pulmonary effort is normal.  Musculoskeletal: Normal range of motion.  Skin:    General: Skin is warm.  Neurological:     Mental Status: She is alert.     Neurovascular status found to be intact muscle strength is adequate range of motion within normal limits.  Patient is found to have long second digits bilateral with inflammation pain of the second MPJ right over left with negative signs for nerve entrapment or positive Tinel sign.  Patient was found to have good digital perfusion well oriented x3     Assessment:  Probability for chronic inflammatory capsulitis second MPJ right with moderate hammertoe deformity elongated digits with pain of the joint surface right over left that may be creating compensatory symptomatology     Plan:  H&P x-rays and conditions reviewed.  Today I did forefoot block right I then aspirated the second MPJ getting out a clear fluid and injected quarter cc dexamethasone Kenalog and gave instructions for padding therapy.  Also placed on anti-inflammatory will see results and orthotics would probably be of great benefit to patient and may ultimately require shortening osteotomy digital fusion depending on response to conservative treatment  X-rays indicating elongated second metatarsal segment bilateral with elongated  digits bilateral

## 2018-11-20 NOTE — Progress Notes (Signed)
Dg  

## 2018-12-04 ENCOUNTER — Encounter: Payer: Self-pay | Admitting: Podiatry

## 2018-12-04 ENCOUNTER — Ambulatory Visit: Payer: BC Managed Care – PPO | Admitting: Podiatry

## 2018-12-04 ENCOUNTER — Other Ambulatory Visit: Payer: Self-pay

## 2018-12-04 ENCOUNTER — Ambulatory Visit: Payer: BLUE CROSS/BLUE SHIELD | Admitting: Podiatry

## 2018-12-04 VITALS — Temp 97.3°F

## 2018-12-04 DIAGNOSIS — M2041 Other hammer toe(s) (acquired), right foot: Secondary | ICD-10-CM | POA: Diagnosis not present

## 2018-12-04 DIAGNOSIS — M2042 Other hammer toe(s) (acquired), left foot: Secondary | ICD-10-CM

## 2018-12-04 DIAGNOSIS — M779 Enthesopathy, unspecified: Secondary | ICD-10-CM | POA: Diagnosis not present

## 2018-12-09 ENCOUNTER — Other Ambulatory Visit: Payer: Self-pay

## 2018-12-09 ENCOUNTER — Ambulatory Visit: Payer: BC Managed Care – PPO | Admitting: Orthotics

## 2018-12-09 DIAGNOSIS — M2041 Other hammer toe(s) (acquired), right foot: Secondary | ICD-10-CM

## 2018-12-09 DIAGNOSIS — M79671 Pain in right foot: Secondary | ICD-10-CM

## 2018-12-09 DIAGNOSIS — M779 Enthesopathy, unspecified: Secondary | ICD-10-CM

## 2018-12-09 DIAGNOSIS — M2042 Other hammer toe(s) (acquired), left foot: Secondary | ICD-10-CM | POA: Diagnosis not present

## 2018-12-09 NOTE — Progress Notes (Signed)
Subjective:   Patient ID: Christine Nolan, female   DOB: 53 y.o.   MRN: [id number]   HPI Patient states pain has improved to a degree but I am still having a lot of problems if I do a lot of walking in my left foot is also hurting   ROS      Objective:  Physical Exam  Neurovascular status intact with patient found to have chronic discomfort forefoot right around the second MPJ right over left with moderate structural deformity also noted associated with it     Assessment:  Chronic capsulitis of the second MPJ secondary to foot structure with improvement from injection but still having biomechanical fault     Plan:  H&P reviewed condition and recommended customized orthotics to reduce the stress against the joint surfaces.  I do think they should be made with metatarsal padding and probable offloading of the second metatarsal and I am referring to ped orthotist to have these done specifically for her.  Patient will be seen back depending on symptomatology as she receives her orthotics

## 2018-12-09 NOTE — Progress Notes (Signed)
ggg

## 2018-12-09 NOTE — Progress Notes (Signed)
Patient came into today to be cast for Custom Foot Orthotics. Upon recommendation of Dr. Paulla Dolly Patient presents with 2nd MPJ cap L >R; metatarsalgia, morton's toe b/l Goals are offload 2nd mpj/ met pad to move pressure more proximal to met heads Plan vendor Richie

## 2018-12-24 ENCOUNTER — Ambulatory Visit: Payer: BC Managed Care – PPO | Admitting: Orthotics

## 2018-12-24 ENCOUNTER — Other Ambulatory Visit: Payer: Self-pay

## 2018-12-24 DIAGNOSIS — M779 Enthesopathy, unspecified: Secondary | ICD-10-CM

## 2018-12-24 NOTE — Progress Notes (Signed)
Patient came in today to pick up custom made foot orthotics.  The goals were accomplished and the patient reported no dissatisfaction with said orthotics.  Patient was advised of breakin period and how to report any issues. 

## 2019-03-09 IMAGING — CT CT ABD-PELV W/O CM
2 of 4 series · 13 of 36 positions shown, 18 images · non-contrast
Comparison: 12/03/2006 ultrasound.

CLINICAL DATA: 50-year-old female with hematuria. On antibiotics.
Prior infection March 2016. Post tubal ligation. Initial
encounter.

EXAM:
CT ABDOMEN AND PELVIS WITHOUT CONTRAST
TECHNIQUE: Multidetector CT imaging of the abdomen and pelvis was performed
following the standard protocol without IV contrast.

[Series 601: coronal body · coronal · 1.00mm/px · 1 of 118 slices shown, 2 images]
[im 40/118  soft-tissue]
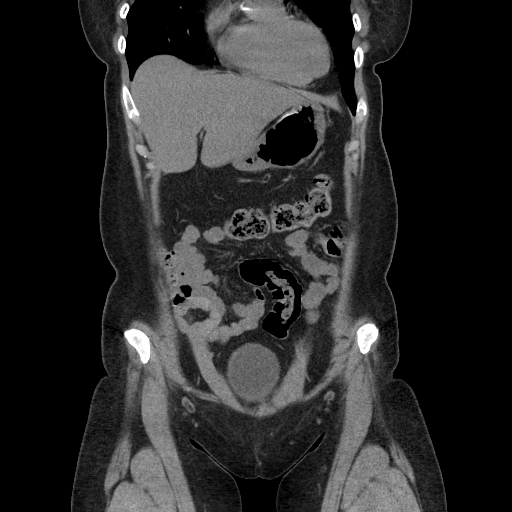
[im 40/118  bone]
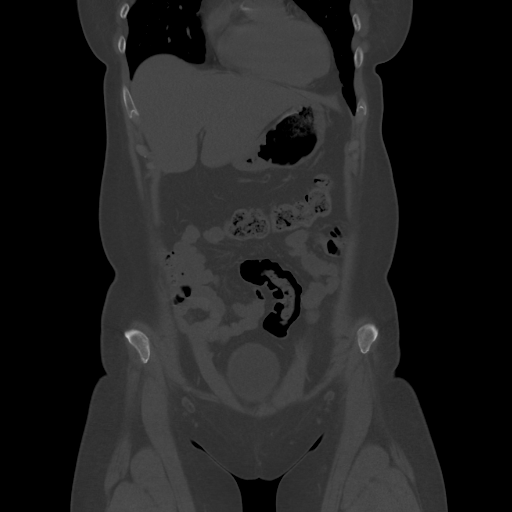

[Series 602: sagittal body · sagittal · 1.00mm/px · 12 of 161 slices shown, 16 images]
[im 9/161  soft-tissue]
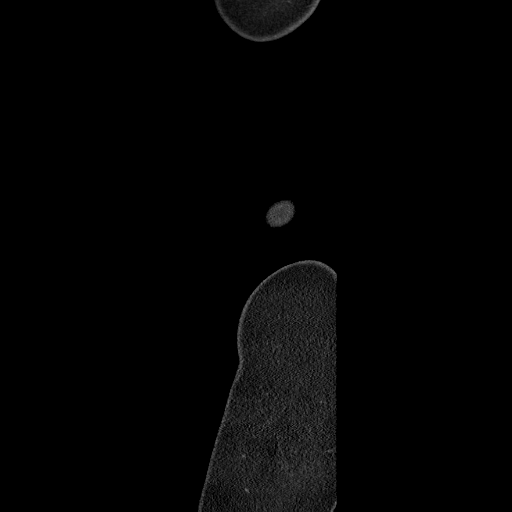
[im 9/161  lung]
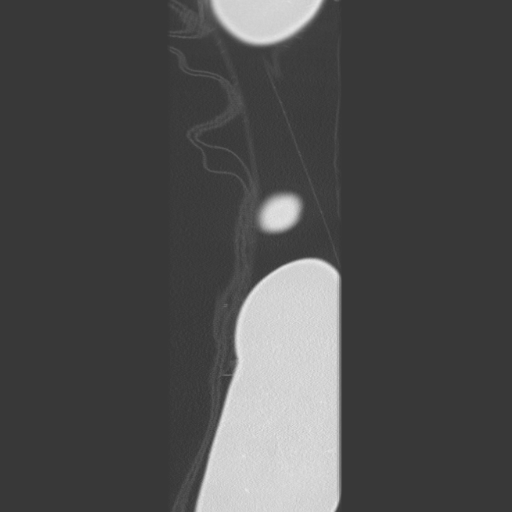
[im 9/161  bone]
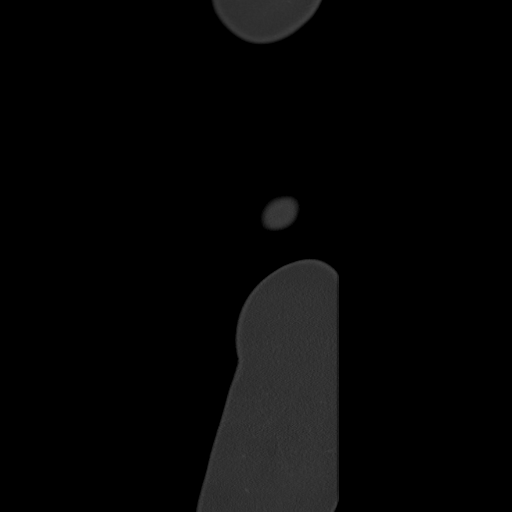
[im 18/161  lung]
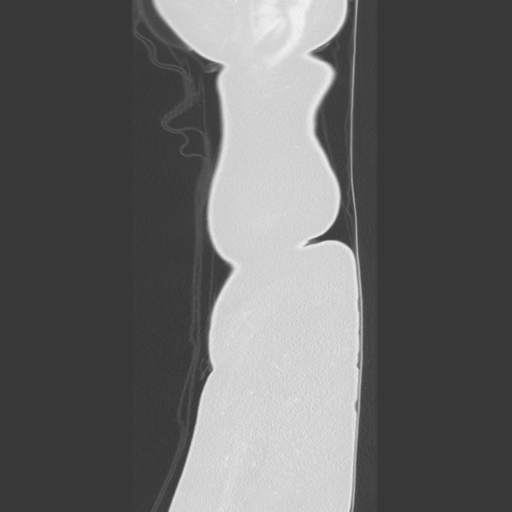
[im 27/161  soft-tissue]
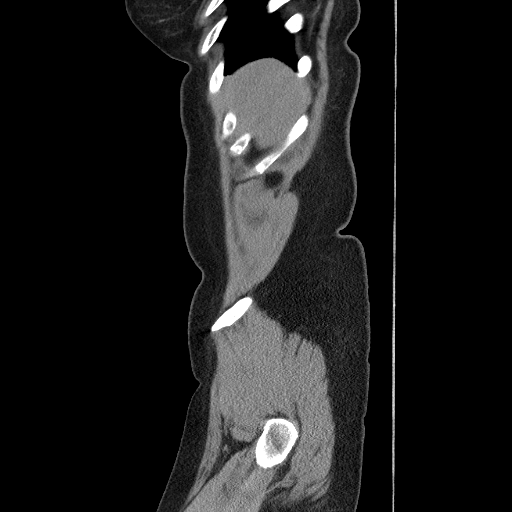
[im 27/161  lung]
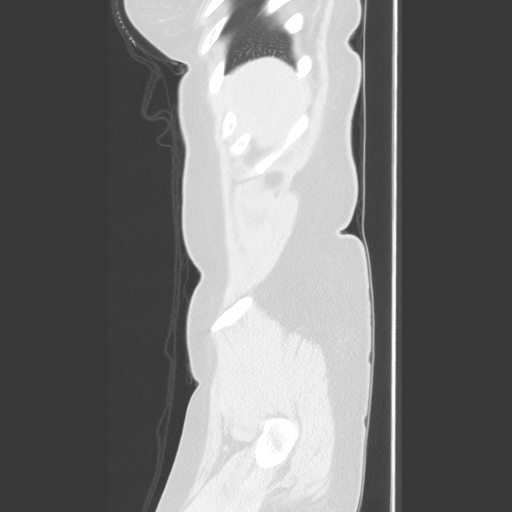
[im 36/161  lung]
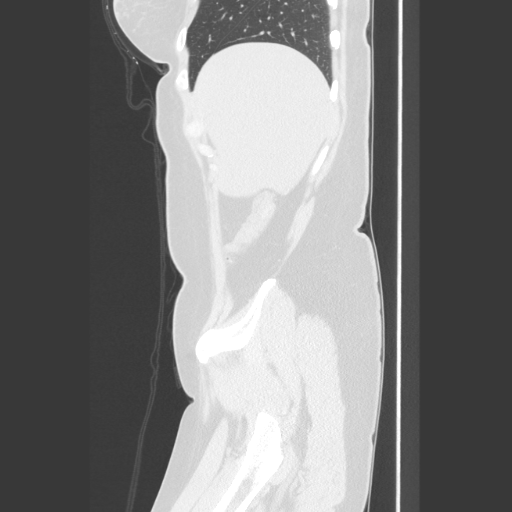
[im 45/161  soft-tissue]
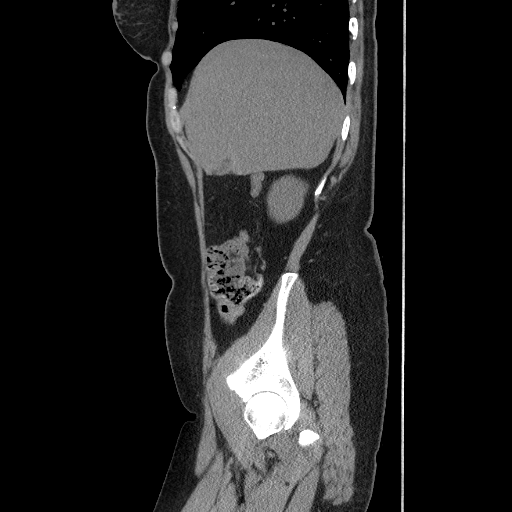
[im 54/161  soft-tissue]
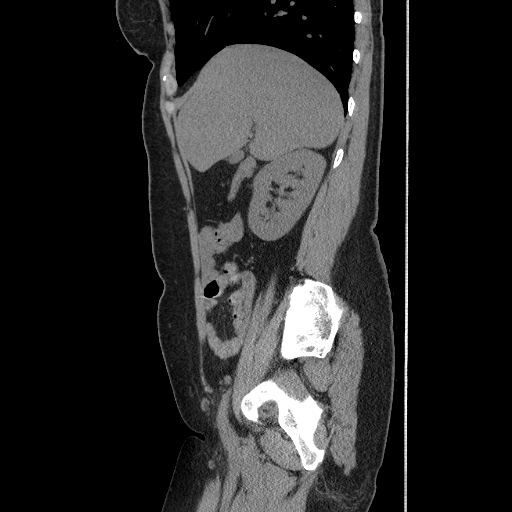
[im 72/161  soft-tissue]
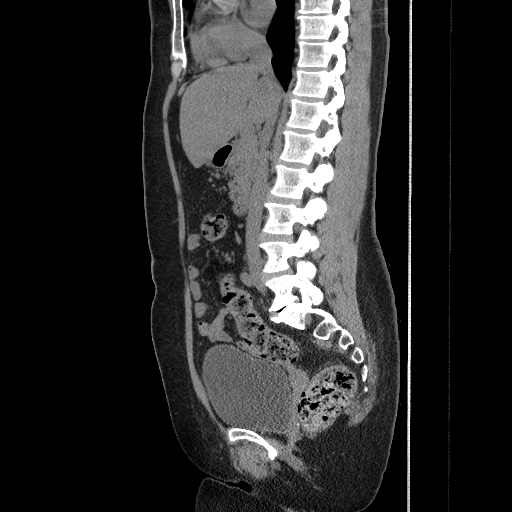
[im 89/161  soft-tissue]
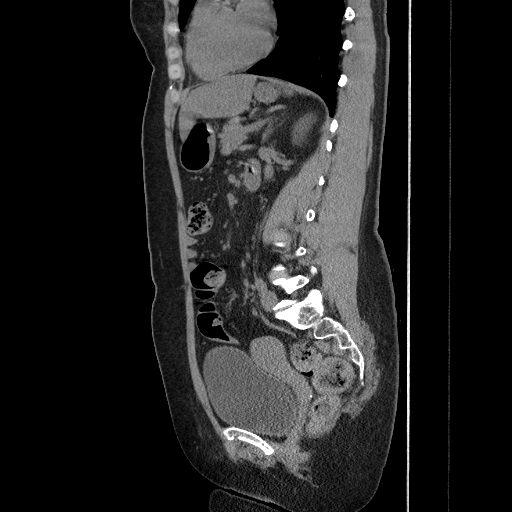
[im 107/161  soft-tissue]
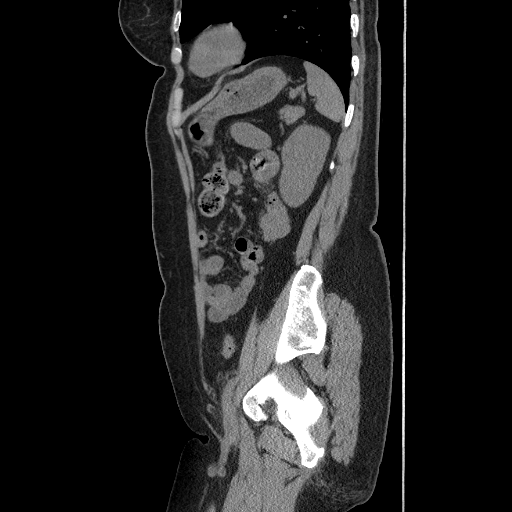
[im 116/161  soft-tissue]
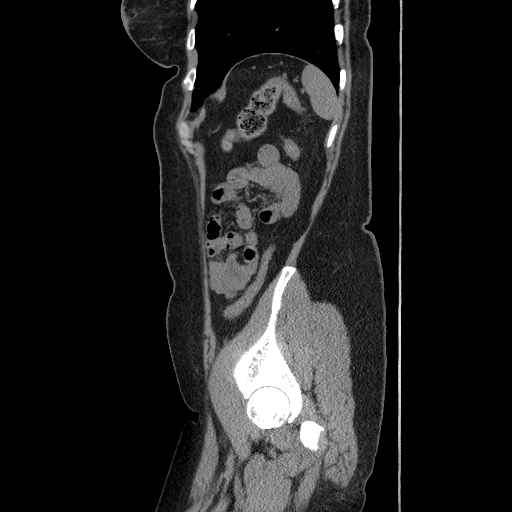
[im 134/161  soft-tissue]
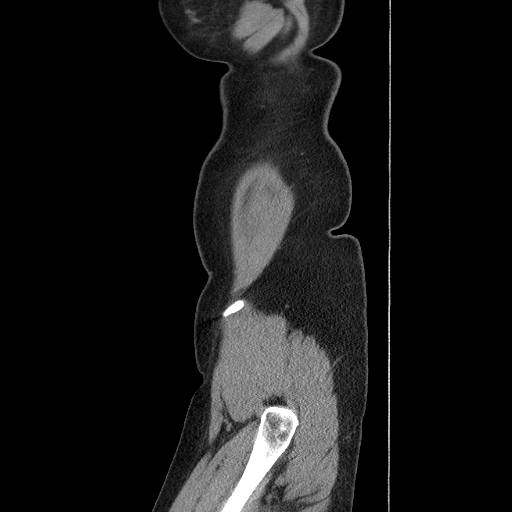
[im 134/161  bone]
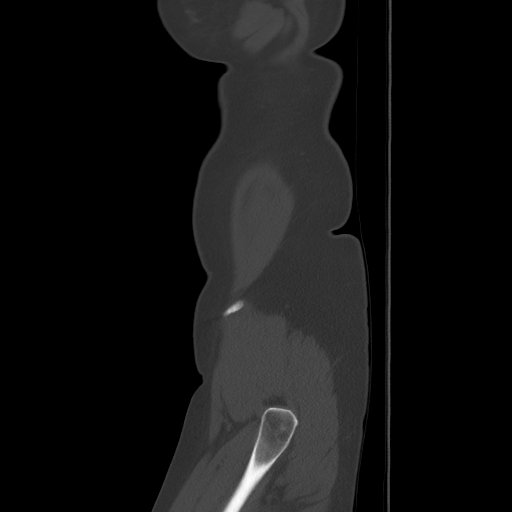
[im 152/161  soft-tissue]
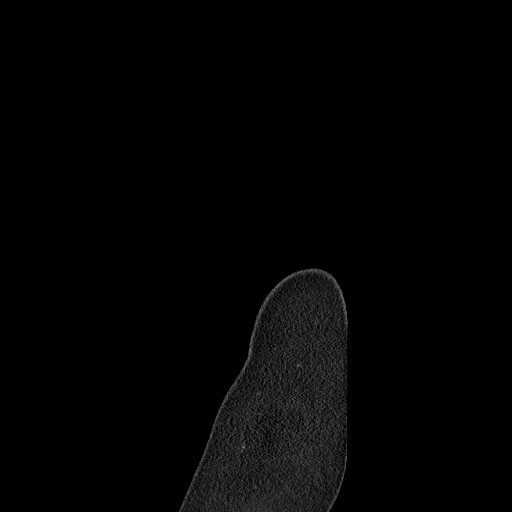

[13 of 36 positions shown; findings below may reference images not displayed]

FINDINGS: Lower chest: Minimal basal atelectasis/ scarring. Coronary artery
and aortic valve calcifications. Heart size within normal limits.

Hepatobiliary: Fatty liver. Taking into account limitation by non
contrast imaging, no worrisome hepatic lesion. Gallbladder sludge
without calcified gallstone.

Pancreas: Taking into account limitation by non contrast imaging, no
mass or inflammation.

Spleen: Taking into account limitation by non contrast imaging, no
mass or enlargement.

Adrenals/Urinary Tract: Calcifications within the pelvis have an
appearance more suggestive of phleboliths rather than ureteral
calculi. Overall, no renal or ureteral obstructing stone or evidence
hydronephrosis.

Taking into account limitation by non contrast imaging, no adrenal
or renal mass. Noncontrast filled views of the urinary bladder
unremarkable.

Stomach/Bowel: No extraluminal bowel inflammatory process.
Specifically, no inflammation surrounds the appendix or terminal
ileum. Portions of the bowel are under distended with limited
evaluation for detection of a mass.

Vascular/Lymphatic: Calcified plaque aorta, iliac arteries and
femoral arteries without aneurysmal dilation. No adenopathy.

Reproductive: No worrisome abnormality.

Other: No free air or abnormal fluid collection. No bowel containing
hernia.

Musculoskeletal: Degenerative changes L5-S1.
IMPRESSION: Calcifications within the pelvis have an appearance more suggestive
of phleboliths rather than ureteral calculi. Overall, no renal or
ureteral obstructing stone or evidence of hydronephrosis.

Fatty liver.

Coronary artery calcifications.  Aortic atherosclerosis.

Degenerative changes L5-S1.

## 2019-04-19 DIAGNOSIS — L570 Actinic keratosis: Secondary | ICD-10-CM | POA: Diagnosis not present

## 2019-04-19 DIAGNOSIS — L814 Other melanin hyperpigmentation: Secondary | ICD-10-CM | POA: Diagnosis not present

## 2019-04-19 DIAGNOSIS — D1801 Hemangioma of skin and subcutaneous tissue: Secondary | ICD-10-CM | POA: Diagnosis not present

## 2019-04-19 DIAGNOSIS — D225 Melanocytic nevi of trunk: Secondary | ICD-10-CM | POA: Diagnosis not present

## 2019-04-19 DIAGNOSIS — L821 Other seborrheic keratosis: Secondary | ICD-10-CM | POA: Diagnosis not present

## 2019-06-26 DIAGNOSIS — B373 Candidiasis of vulva and vagina: Secondary | ICD-10-CM | POA: Diagnosis not present

## 2019-06-26 DIAGNOSIS — J018 Other acute sinusitis: Secondary | ICD-10-CM | POA: Diagnosis not present

## 2019-06-26 DIAGNOSIS — I10 Essential (primary) hypertension: Secondary | ICD-10-CM | POA: Diagnosis not present

## 2019-07-15 ENCOUNTER — Ambulatory Visit: Payer: BC Managed Care – PPO | Attending: Internal Medicine

## 2019-07-15 DIAGNOSIS — Z20822 Contact with and (suspected) exposure to covid-19: Secondary | ICD-10-CM | POA: Insufficient documentation

## 2019-07-17 LAB — NOVEL CORONAVIRUS, NAA: SARS-CoV-2, NAA: NOT DETECTED

## 2019-07-26 DIAGNOSIS — Z01419 Encounter for gynecological examination (general) (routine) without abnormal findings: Secondary | ICD-10-CM | POA: Diagnosis not present

## 2019-07-26 DIAGNOSIS — Z6825 Body mass index (BMI) 25.0-25.9, adult: Secondary | ICD-10-CM | POA: Diagnosis not present

## 2019-07-26 DIAGNOSIS — Z1231 Encounter for screening mammogram for malignant neoplasm of breast: Secondary | ICD-10-CM | POA: Diagnosis not present

## 2019-08-10 ENCOUNTER — Encounter: Payer: Self-pay | Admitting: Family Medicine

## 2019-08-10 DIAGNOSIS — Z0000 Encounter for general adult medical examination without abnormal findings: Secondary | ICD-10-CM | POA: Diagnosis not present

## 2019-08-10 DIAGNOSIS — R319 Hematuria, unspecified: Secondary | ICD-10-CM | POA: Diagnosis not present

## 2019-08-10 DIAGNOSIS — E782 Mixed hyperlipidemia: Secondary | ICD-10-CM | POA: Diagnosis not present

## 2019-08-10 DIAGNOSIS — I10 Essential (primary) hypertension: Secondary | ICD-10-CM | POA: Diagnosis not present

## 2019-08-10 DIAGNOSIS — E041 Nontoxic single thyroid nodule: Secondary | ICD-10-CM | POA: Diagnosis not present

## 2019-08-11 DIAGNOSIS — E041 Nontoxic single thyroid nodule: Secondary | ICD-10-CM | POA: Diagnosis not present

## 2019-08-18 ENCOUNTER — Ambulatory Visit: Payer: Self-pay | Attending: Internal Medicine

## 2019-08-18 DIAGNOSIS — Z20822 Contact with and (suspected) exposure to covid-19: Secondary | ICD-10-CM

## 2019-08-19 LAB — NOVEL CORONAVIRUS, NAA: SARS-CoV-2, NAA: NOT DETECTED

## 2019-09-05 NOTE — Progress Notes (Signed)
Cardiology Office Note   Date:  09/06/2019   ID:  Christine Nolan, DOB [birth year], MRN [id number]  PCP:  Mayra Neer, MD    No chief complaint on file.  Coronary artery calcification  Wt Readings from Last 3 Encounters:  09/06/19 167 lb 3.2 oz (75.8 kg)  09/01/18 165 lb 6.4 oz (75 kg)  09/16/17 177 lb (80.3 kg)       History of Present Illness: Christine Nolan is a 54 y.o. female  Who had a CT scan in 2018 whichshowed atherosclerosis in the aorta and coronary arteries, with aortic valve calcification. She has family h/o CAD. Her daughter recently had SCAD.   Father with AFib and leaky valve. Mother had CAD in her mid 61s with CABG. She was overweight.   She had some GERD sx in 3/18 prompting a treadmill. She was stressed at the time as well.   She had a stress test in 3/18:  The patient walked for 6:30 of a standard Bruce protocol. She achieved a peak HR of 148 which is 88% predicted maximal HR .  There was insignificant nonspecific ST depression in the lateral leads which resolved quickly in the recovery phase. These ST changes were likely due to to the hypertensive response .  Blood pressure demonstrated a hypertensive response to exercise. Negative GXT  Repeat GXT in 2019 was negative.  She has been on Lexapro and Ativan for anxiety.   Since the last visit, she has been working remotely.  She has had a lot of stress at work.  SHe had a panic attack in Jan 2021.    She walks some at home.  She has had some leg pain and back pain from sitting at her computer.    Denies : Exertional Chest pain. Dizziness. Leg edema. Nitroglycerin use. Orthopnea. Palpitations. Paroxysmal nocturnal dyspnea. Shortness of breath. Syncope.   Occasional CP with stress.  Mild tightness today.     Past Medical History:  Diagnosis Date  . CAD (coronary artery disease)   . Fatty liver   . Flank pain   . Hematuria, unspecified   . Hyperlipidemia   . Hypertension   . SOB  (shortness of breath)   . UTI (urinary tract infection)     Past Surgical History:  Procedure Laterality Date  . btl    . CERVICAL DISC ARTHROPLASTY    . CYST REMOVAL HAND    . KNEE SURGERY Left    knee repair  . left foot surgery    . WISDOM TOOTH EXTRACTION       Current Outpatient Medications  Medication Sig Dispense Refill  . atorvastatin (LIPITOR) 20 MG tablet Take 40 mg by mouth daily.     . Calcium Carbonate-Vitamin D3 (CALCIUM 600/VITAMIN D) 600-400 MG-UNIT TABS Take 1 tablet by mouth daily.     Marland Kitchen estradiol (ESTRACE) 2 MG tablet Take 2 mg by mouth daily.    Marland Kitchen LORazepam (ATIVAN) 0.5 MG tablet lorazepam 0.5 mg tablet  TAKE 1/2 TO 1 TABLET TWICE DAILY AS NEEDED FOR ANXIETY    . medroxyPROGESTERone (PROVERA) 2.5 MG tablet Take 2.5 mg by mouth daily.  3  . metoprolol succinate (TOPROL-XL) 50 MG 24 hr tablet Take 50 mg by mouth at bedtime. Take with or immediately following a meal.    . Multiple Vitamins-Minerals (MULTIVITAMIN ADULT PO) Take 1 tablet by mouth daily.     . sertraline (ZOLOFT) 50 MG tablet Take 50 mg by mouth daily.    Marland Kitchen  trimethoprim (TRIMPEX) 100 MG tablet Take 100 mg by mouth daily.  11   No current facility-administered medications for this visit.    Allergies:   Iodine, Shellfish allergy, and Tamiflu [oseltamivir phosphate]    Social History:  The patient  reports that she has never smoked. She has never used smokeless tobacco. She reports that she does not drink alcohol or use drugs.   Family History:  The patient's family history includes Heart attack (age of onset: 95) in her daughter; Heart attack (age of onset: 58) in her mother; Heart disease in her father.    ROS:  Please see the history of present illness.   Otherwise, review of systems are positive for stress.   All other systems are reviewed and negative.    PHYSICAL EXAM: VS:  BP 130/80   Pulse 72   Ht 5\' 8"  (1.727 m)   Wt 167 lb 3.2 oz (75.8 kg)   SpO2 98%   BMI 25.42 kg/m  , BMI Body  mass index is 25.42 kg/m. GEN: Well nourished, well developed, in no acute distress  HEENT: normal  Neck: no JVD, carotid bruits, or masses Cardiac: RRR; no murmurs, rubs, or gallops,no edema  Respiratory:  clear to auscultation bilaterally, normal work of breathing GI: soft, nontender, nondistended, + BS MS: no deformity or atrophy  Skin: warm and dry, no rash Neuro:  Strength and sensation are intact Psych: euthymic mood, full affect   EKG:   The ekg ordered today demonstrates normal ECG   Recent Labs: No results found for requested labs within last 8760 hours.   Lipid Panel No results found for: CHOL, TRIG, HDL, CHOLHDL, VLDL, LDLCALC, LDLDIRECT   Other studies Reviewed: Additional studies/ records that were reviewed today with results demonstrating: LDL 116 in 2/21 from PMD.   ASSESSMENT AND PLAN:  1.   Coronary artery calcification: She has been in the 99th percentile for her age.  She has had atypical chest discomfort over the past several years. 2.   Hyperlipidemia: She has had elevated triglycerides in the past.  Lipitor increased to 40 mg daily.  LDL target < 100. 3.   Hypertension/increased blood pressure readings: Continue metoprolol.  Continue to check at home.  4. Can try to exercise in middle of the work day, to help with stress, since she works from home mostly. Increase exercise to target below.   Current medicines are reviewed at length with the patient today.  The patient concerns regarding her medicines were addressed.  The following changes have been made:  No change  Labs/ tests ordered today include:   Orders Placed This Encounter  Procedures  . EKG 12-Lead    Recommend 150 minutes/week of aerobic exercise Low fat, low carb, high fiber diet recommended  Disposition:   FU in 1 year   Signed, 3/21, MD  09/06/2019 8:36 AM    [facility] [address] [place], [place], [place]  [place] Phone: [phone]; Fax:  [phone]

## 2019-09-06 ENCOUNTER — Encounter: Payer: Self-pay | Admitting: Interventional Cardiology

## 2019-09-06 ENCOUNTER — Ambulatory Visit: Payer: BC Managed Care – PPO | Admitting: Interventional Cardiology

## 2019-09-06 ENCOUNTER — Other Ambulatory Visit: Payer: Self-pay

## 2019-09-06 VITALS — BP 130/80 | HR 72 | Ht 68.0 in | Wt 167.2 lb

## 2019-09-06 DIAGNOSIS — E782 Mixed hyperlipidemia: Secondary | ICD-10-CM | POA: Diagnosis not present

## 2019-09-06 DIAGNOSIS — I10 Essential (primary) hypertension: Secondary | ICD-10-CM | POA: Diagnosis not present

## 2019-09-06 DIAGNOSIS — I2510 Atherosclerotic heart disease of native coronary artery without angina pectoris: Secondary | ICD-10-CM | POA: Diagnosis not present

## 2019-09-06 NOTE — Patient Instructions (Signed)

## 2019-09-17 DIAGNOSIS — R3121 Asymptomatic microscopic hematuria: Secondary | ICD-10-CM | POA: Diagnosis not present

## 2019-09-17 DIAGNOSIS — R351 Nocturia: Secondary | ICD-10-CM | POA: Diagnosis not present

## 2019-10-30 HISTORY — PX: OTHER SURGICAL HISTORY: SHX169

## 2019-11-01 ENCOUNTER — Ambulatory Visit (INDEPENDENT_AMBULATORY_CARE_PROVIDER_SITE_OTHER): Payer: BC Managed Care – PPO

## 2019-11-01 ENCOUNTER — Encounter: Payer: Self-pay | Admitting: Podiatry

## 2019-11-01 ENCOUNTER — Other Ambulatory Visit: Payer: Self-pay

## 2019-11-01 ENCOUNTER — Ambulatory Visit: Payer: BC Managed Care – PPO | Admitting: Podiatry

## 2019-11-01 ENCOUNTER — Other Ambulatory Visit: Payer: Self-pay | Admitting: Podiatry

## 2019-11-01 VITALS — Temp 98.4°F

## 2019-11-01 DIAGNOSIS — M778 Other enthesopathies, not elsewhere classified: Secondary | ICD-10-CM | POA: Diagnosis not present

## 2019-11-01 DIAGNOSIS — M779 Enthesopathy, unspecified: Secondary | ICD-10-CM

## 2019-11-01 DIAGNOSIS — M2041 Other hammer toe(s) (acquired), right foot: Secondary | ICD-10-CM

## 2019-11-01 DIAGNOSIS — M2042 Other hammer toe(s) (acquired), left foot: Secondary | ICD-10-CM | POA: Diagnosis not present

## 2019-11-01 DIAGNOSIS — M79671 Pain in right foot: Secondary | ICD-10-CM

## 2019-11-01 MED ORDER — DICLOFENAC SODIUM 75 MG PO TBEC: 75.0000 mg | DELAYED_RELEASE_TABLET | Freq: Two times a day (BID) | ORAL | 2 refills | Status: DC

## 2019-11-01 NOTE — Progress Notes (Signed)
Subjective:   Patient ID: Christine Nolan, female   DOB: 54 y.o.   MRN: [id number]   HPI Patient states she is having problems again with the second metatarsal and stated she did have relief for a period of time it is been gradually becoming more bothersome over the last few months.  Patient does not remember any other specific issue it is been approximately 11 months and she has been here     ROS      Objective:  Physical Exam  Has pain when I palpated the second MPJ of the right foot with fluid buildup has elongated second digit right over left with the left foot being mildly tender and also has complained of tightness of the dorsum of the foot     Assessment:  Appears to be inflammatory capsulitis of the second MPJ right which is most likely related to the elongation of the digit creating stress against the metatarsal phalangeal joint     Plan:  Reviewed condition at great length today did proximal nerve block right aspirated the joint getting a small amount of clear fluid injected quarter cc dexamethasone Kenalog thick plantar padding was dispensed and we discussed ultimate digital shortening along with shortening osteotomy.  Patient will be reevaluated hopefully we can keep her better for an extended period of time  X-rays indicate there is been no significant arthritic change which is occurring up be appears to be strictly soft tissue currently does have elongated metatarsal elongated digit

## 2019-11-01 NOTE — Patient Instructions (Signed)
Bunion  A bunion is a bump on the base of the big toe that forms when the bones of the big toe joint move out of position. Bunions may be small at first, but they often get larger over time. They can make walking painful. What are the causes? A bunion may be caused by:  Wearing narrow or pointed shoes that force the big toe to press against the other toes.  Abnormal foot development that causes the foot to roll inward (pronate).  Changes in the foot that are caused by certain diseases, such as rheumatoid arthritis or polio.  A foot injury. What increases the risk? The following factors may make you more likely to develop this condition:  Wearing shoes that squeeze the toes together.  Having certain diseases, such as: ? Rheumatoid arthritis. ? Polio. ? Cerebral palsy.  Having family members who have bunions.  Being born with a foot deformity, such as flat feet or low arches.  Doing activities that put a lot of pressure on the feet, such as ballet dancing. What are the signs or symptoms? The main symptom of a bunion is a noticeable bump on the big toe. Other symptoms may include:  Pain.  Swelling around the big toe.  Redness and inflammation.  Thick or hardened skin on the big toe or between the toes.  Stiffness or loss of motion in the big toe.  Trouble with walking. How is this diagnosed? A bunion may be diagnosed based on your symptoms, medical history, and activities. You may have tests, such as:  X-rays. These allow your health care provider to check the position of the bones in your foot and look for damage to your joint. They also help your health care provider determine the severity of your bunion and the best way to treat it.  Joint aspiration. In this test, a sample of fluid is removed from the toe joint. This test may be done if you are in a lot of pain. It helps rule out diseases that cause painful swelling of the joints, such as arthritis. How is this  treated? Treatment depends on the severity of your symptoms. The goal of treatment is to relieve symptoms and prevent the bunion from getting worse. Your health care provider may recommend:  Wearing shoes that have a wide toe box.  Using bunion pads to cushion the affected area.  Taping your toes together to keep them in a normal position.  Placing a device inside your shoe (orthotics) to help reduce pressure on your toe joint.  Taking medicine to ease pain, inflammation, and swelling.  Applying heat or ice to the affected area.  Doing stretching exercises.  Surgery to remove scar tissue and move the toes back into their normal position. This treatment is rare. Follow these instructions at home: Managing pain, stiffness, and swelling   If directed, put ice on the painful area: ? Put ice in a plastic bag. ? Place a towel between your skin and the bag. ? Leave the ice on for 20 minutes, 2-3 times a day. Activity   If directed, apply heat to the affected area before you exercise. Use the heat source that your health care provider recommends, such as a moist heat pack or a heating pad. ? Place a towel between your skin and the heat source. ? Leave the heat on for 20-30 minutes. ? Remove the heat if your skin turns bright red. This is especially important if you are unable to feel pain,   heat, or cold. You may have a greater risk of getting burned.  Do exercises as told by your health care provider. General instructions  Support your toe joint with proper footwear, shoe padding, or taping as told by your health care provider.  Take over-the-counter and prescription medicines only as told by your health care provider.  Keep all follow-up visits as told by your health care provider. This is important. Contact a health care provider if your symptoms:  Get worse.  Do not improve in 2 weeks. Get help right away if you have:  Severe pain and trouble with walking. Summary  A  bunion is a bump on the base of the big toe that forms when the bones of the big toe joint move out of position.  Bunions can make walking painful.  Treatment depends on the severity of your symptoms.  Support your toe joint with proper footwear, shoe padding, or taping as told by your health care provider. This information is not intended to replace advice given to you by your health care provider. Make sure you discuss any questions you have with your health care provider. Document Revised: 12/22/2017 Document Reviewed: 10/28/2017 Elsevier Patient Education  2020 [employer]    Hammer Toe  Hammer toe is a change in the shape (a deformity) of your toe. The deformity causes the middle joint of your toe to stay bent. This causes pain, especially when you are wearing shoes. Hammer toe starts gradually. At first, the toe can be straightened. Gradually over time, the deformity becomes stiff and permanent. Early treatments to keep the toe straight may relieve pain. As the deformity becomes stiff and permanent, surgery may be needed to straighten the toe. What are the causes? Hammer toe is caused by abnormal bending of the toe joint that is closest to your foot. It happens gradually over time. This pulls on the muscles and connections (tendons) of the toe joint, making them weak and stiff. It is often related to wearing shoes that are too short or narrow and do not let your toes straighten. What increases the risk? You may be at greater risk for hammer toe if you:  Are female.  Are older.  Wear shoes that are too small.  Wear high-heeled shoes that pinch your toes.  Are a ballet dancer.  Have a second toe that is longer than your big toe (first toe).  Injure your foot or toe.  Have arthritis.  Have a family history of hammer toe.  Have a nerve or muscle disorder. What are the signs or symptoms? The main symptoms of this condition are pain and deformity of the toe. The pain is  worse when wearing shoes, walking, or running. Other symptoms may include:  Corns or calluses over the bent part of the toe or between the toes.  Redness and a burning feeling on the toe.  An open sore that forms on the top of the toe.  Not being able to straighten the toe. How is this diagnosed? This condition is diagnosed based on your symptoms and a physical exam. During the exam, your health care provider will try to straighten your toe to see how stiff the deformity is. You may also have tests, such as:  A blood test to check for rheumatoid arthritis.  An X-ray to show how severe the deformity is. How is this treated? Treatment for this condition will depend on how stiff the deformity is. Surgery is often needed. However, sometimes a hammer   toe can be straightened without surgery. Treatments that do not involve surgery include:  Taping the toe into a straightened position.  Using pads and cushions to protect the toe (orthotics).  Wearing shoes that provide enough room for the toes.  Doing toe-stretching exercises at home.  Taking an NSAID to reduce pain and swelling. If these treatments do not help or the toe cannot be straightened, surgery is the next option. The most common surgeries used to straighten a hammer toe include:  Arthroplasty. In this procedure, part of the joint is removed, and that allows the toe to straighten.  Fusion. In this procedure, cartilage between the two bones of the joint is taken out and the bones are fused together into one longer bone.  Implantation. In this procedure, part of the bone is removed and replaced with an implant to let the toe move again.  Flexor tendon transfer. In this procedure, the tendons that curl the toes down (flexor tendons) are repositioned. Follow these instructions at home:  Take over-the-counter and prescription medicines only as told by your health care provider.  Do toe straightening and stretching exercises as  told by your health care provider.  Keep all follow-up visits as told by your health care provider. This is important. How is this prevented?  Wear shoes that give your toes enough room and do not cause pain.  Do not wear high-heeled shoes. Contact a health care provider if:  Your pain gets worse.  Your toe becomes red or swollen.  You develop an open sore on your toe. This information is not intended to replace advice given to you by your health care provider. Make sure you discuss any questions you have with your health care provider. Document Revised: 05/30/2017 Document Reviewed: 10/11/2015 Elsevier Patient Education  2020 [employer]  

## 2019-11-10 DIAGNOSIS — E782 Mixed hyperlipidemia: Secondary | ICD-10-CM | POA: Diagnosis not present

## 2019-11-16 DIAGNOSIS — Z1159 Encounter for screening for other viral diseases: Secondary | ICD-10-CM | POA: Diagnosis not present

## 2019-11-19 DIAGNOSIS — Z8371 Family history of colonic polyps: Secondary | ICD-10-CM | POA: Diagnosis not present

## 2019-11-19 DIAGNOSIS — Z1211 Encounter for screening for malignant neoplasm of colon: Secondary | ICD-10-CM | POA: Diagnosis not present

## 2019-11-19 DIAGNOSIS — D125 Benign neoplasm of sigmoid colon: Secondary | ICD-10-CM | POA: Diagnosis not present

## 2019-11-19 DIAGNOSIS — Z800 Family history of malignant neoplasm of digestive organs: Secondary | ICD-10-CM | POA: Diagnosis not present

## 2019-11-22 ENCOUNTER — Other Ambulatory Visit: Payer: Self-pay

## 2019-11-22 ENCOUNTER — Encounter: Payer: Self-pay | Admitting: Podiatry

## 2019-11-22 ENCOUNTER — Ambulatory Visit: Payer: BC Managed Care – PPO | Admitting: Podiatry

## 2019-11-22 VITALS — Temp 97.9°F

## 2019-11-22 DIAGNOSIS — M779 Enthesopathy, unspecified: Secondary | ICD-10-CM

## 2019-11-24 NOTE — Progress Notes (Signed)
Subjective:   Patient ID: Christine Nolan, female   DOB: 54 y.o.   MRN: [id number]   HPI Patient states my right 1 feels better after he worked on it and the left one has been bothering me more.  States that it is sore when palpated and it is making it difficult to walk comfortably neuro   ROS      Objective:  Physical Exam  Vascular status intact with inflammation of the second MPJ left with fluid buildup with the right when doing quite a bit better with patient having digital deformities     Assessment:  Inflammatory capsulitis left with patient having long-term digital deformities bilateral     Plan:  H&P education concerning digital deformities and continuation of orthotics rigid bottom shoes.  For the left I did proximal nerve block sterile aspiration of the second MPJ getting out a small amount of clear fluid and injected quarter cc dexamethasone Kenalog into the joint along with padding.  Reappoint for [place] to recheck

## 2020-01-24 DIAGNOSIS — Z6826 Body mass index (BMI) 26.0-26.9, adult: Secondary | ICD-10-CM | POA: Diagnosis not present

## 2020-01-24 DIAGNOSIS — N950 Postmenopausal bleeding: Secondary | ICD-10-CM | POA: Diagnosis not present

## 2020-01-27 DIAGNOSIS — Z6825 Body mass index (BMI) 25.0-25.9, adult: Secondary | ICD-10-CM | POA: Diagnosis not present

## 2020-01-27 DIAGNOSIS — N950 Postmenopausal bleeding: Secondary | ICD-10-CM | POA: Diagnosis not present

## 2020-02-17 ENCOUNTER — Other Ambulatory Visit: Payer: Self-pay

## 2020-02-17 ENCOUNTER — Encounter (HOSPITAL_BASED_OUTPATIENT_CLINIC_OR_DEPARTMENT_OTHER): Payer: Self-pay | Admitting: Obstetrics

## 2020-02-17 NOTE — Progress Notes (Addendum)
Spoke w/ via phone for pre-op interview---PT Lab needs dos---- I stat 8, cbc               COVID test ------02-22-2020 Arrive at -------1015 am 02-25-2020 NPO after MN NO Solid Food.  Clear liquids from MN until---1015 am  Medications to take morning of surgery -----lorazepam prn, trimethoprim Diabetic medication -----n/a Patient Special Instructions -----none Pre-Op special Istructions -----none Patient verbalized understanding of instructions that were given at this phone interview. Patient denies shortness of breath, chest pain, fever, cough at this phone interview.  Anesthesia Review: coronary artery calcification, htn, thryoid nodule  PCP: dr Cam Hai Cardiologist :dr Lannie Fields 09-06-2019 epic Chest x-ray :none EKG :09-06-2019 epic Echo :none Stress test: 08-2016 per dr Eldridge Dace note Cardiac Cath : none Activity level: no trouble with climibng flight of steps and works full time and does housework Sleep Study/ CPAP :n/a Fasting Blood Sugar :      / Checks Blood Sugar -- times a day:  n/a Blood Thinner/ Instructions /Last Dose:n/a ASA / Instructions/ Last Dose : n/a

## 2020-02-22 ENCOUNTER — Other Ambulatory Visit (HOSPITAL_COMMUNITY)
Admission: RE | Admit: 2020-02-22 | Discharge: 2020-02-22 | Disposition: A | Discharge status: Home / Self Care | Payer: BC Managed Care – PPO | Source: Ambulatory Visit | Attending: Obstetrics | Admitting: Obstetrics

## 2020-02-22 DIAGNOSIS — Z20822 Contact with and (suspected) exposure to covid-19: Secondary | ICD-10-CM | POA: Insufficient documentation

## 2020-02-22 DIAGNOSIS — Z01812 Encounter for preprocedural laboratory examination: Secondary | ICD-10-CM | POA: Diagnosis not present

## 2020-02-22 LAB — SARS CORONAVIRUS 2 (TAT 6-24 HRS): SARS Coronavirus 2: NEGATIVE

## 2020-02-24 NOTE — Anesthesia Preprocedure Evaluation (Addendum)
Anesthesia Evaluation  Patient identified by MRN, date of birth, ID band Patient awake    Reviewed: Allergy & Precautions, NPO status , Patient's Chart, lab work & pertinent test results, reviewed documented beta blocker date and time   History of Anesthesia Complications Negative for: history of anesthetic complications  Airway Mallampati: II  TM Distance: >3 FB Neck ROM: Full    Dental no notable dental hx.    Pulmonary neg pulmonary ROS,    Pulmonary exam normal        Cardiovascular hypertension, Pt. on medications and Pt. on home beta blockers + CAD  Normal cardiovascular exam     Neuro/Psych Anxiety negative neurological ROS     GI/Hepatic negative GI ROS, Neg liver ROS,   Endo/Other  negative endocrine ROS  Renal/GU negative Renal ROS  negative genitourinary   Musculoskeletal negative musculoskeletal ROS (+)   Abdominal   Peds  Hematology negative hematology ROS (+)   Anesthesia Other Findings Day of surgery medications reviewed with patient.  Reproductive/Obstetrics negative OB ROS                            Anesthesia Physical Anesthesia Plan  ASA: III  Anesthesia Plan: General   Post-op Pain Management:    Induction: Intravenous  PONV Risk Score and Plan: 4 or greater and Midazolam, Ondansetron, Dexamethasone and Treatment may vary due to age or medical condition  Airway Management Planned: LMA  Additional Equipment: None  Intra-op Plan:   Post-operative Plan: Extubation in OR  Informed Consent: I have reviewed the patients History and Physical, chart, labs and discussed the procedure including the risks, benefits and alternatives for the proposed anesthesia with the patient or authorized representative who has indicated his/her understanding and acceptance.     Dental advisory given  Plan Discussed with: CRNA  Anesthesia Plan Comments:         Anesthesia Quick Evaluation

## 2020-02-25 ENCOUNTER — Encounter (HOSPITAL_BASED_OUTPATIENT_CLINIC_OR_DEPARTMENT_OTHER)
Admission: RE | Disposition: A | Discharge status: Home / Self Care | Payer: Self-pay | Source: Home / Self Care | Attending: Obstetrics

## 2020-02-25 ENCOUNTER — Ambulatory Visit (HOSPITAL_BASED_OUTPATIENT_CLINIC_OR_DEPARTMENT_OTHER): Payer: BC Managed Care – PPO | Admitting: Anesthesiology

## 2020-02-25 ENCOUNTER — Other Ambulatory Visit: Payer: Self-pay

## 2020-02-25 ENCOUNTER — Ambulatory Visit (HOSPITAL_BASED_OUTPATIENT_CLINIC_OR_DEPARTMENT_OTHER)
Admission: RE | Admit: 2020-02-25 | Discharge: 2020-02-25 | Disposition: A | Discharge status: Home / Self Care | Payer: BC Managed Care – PPO | Attending: Obstetrics | Admitting: Obstetrics

## 2020-02-25 ENCOUNTER — Encounter (HOSPITAL_BASED_OUTPATIENT_CLINIC_OR_DEPARTMENT_OTHER): Payer: Self-pay | Admitting: Obstetrics

## 2020-02-25 DIAGNOSIS — N882 Stricture and stenosis of cervix uteri: Secondary | ICD-10-CM | POA: Insufficient documentation

## 2020-02-25 DIAGNOSIS — N950 Postmenopausal bleeding: Secondary | ICD-10-CM | POA: Diagnosis not present

## 2020-02-25 DIAGNOSIS — I2510 Atherosclerotic heart disease of native coronary artery without angina pectoris: Secondary | ICD-10-CM | POA: Diagnosis not present

## 2020-02-25 DIAGNOSIS — I10 Essential (primary) hypertension: Secondary | ICD-10-CM | POA: Insufficient documentation

## 2020-02-25 HISTORY — PX: HYSTEROSCOPY WITH D & C: SHX1775

## 2020-02-25 HISTORY — DX: Nontoxic single thyroid nodule: E04.1

## 2020-02-25 HISTORY — DX: Postmenopausal bleeding: N95.0

## 2020-02-25 LAB — POCT I-STAT, CHEM 8
BUN: 16 mg/dL (ref 6–20)
Calcium, Ion: 1.22 mmol/L (ref 1.15–1.40)
Chloride: 105 mmol/L (ref 98–111)
Creatinine, Ser: 0.9 mg/dL (ref 0.44–1.00)
Glucose, Bld: 95 mg/dL (ref 70–99)
HCT: 41 % (ref 36.0–46.0)
Hemoglobin: 13.9 g/dL (ref 12.0–15.0)
Potassium: 4 mmol/L (ref 3.5–5.1)
Sodium: 140 mmol/L (ref 135–145)
TCO2: 21 mmol/L — ABNORMAL LOW (ref 22–32)

## 2020-02-25 LAB — CBC
HCT: 40.7 % (ref 36.0–46.0)
Hemoglobin: 14 g/dL (ref 12.0–15.0)
MCH: 31.9 pg (ref 26.0–34.0)
MCHC: 34.4 g/dL (ref 30.0–36.0)
MCV: 92.7 fL (ref 80.0–100.0)
Platelets: 226 10*3/uL (ref 150–400)
RBC: 4.39 MIL/uL (ref 3.87–5.11)
RDW: 13.1 % (ref 11.5–15.5)
WBC: 7.8 10*3/uL (ref 4.0–10.5)
nRBC: 0 % (ref 0.0–0.2)

## 2020-02-25 SURGERY — DILATATION AND CURETTAGE /HYSTEROSCOPY
Anesthesia: General | Site: Vagina

## 2020-02-25 MED ORDER — FENTANYL CITRATE (PF) 100 MCG/2ML IJ SOLN: 25.0000 ug | INTRAMUSCULAR | Status: DC | PRN

## 2020-02-25 MED ORDER — MIDAZOLAM HCL 2 MG/2ML IJ SOLN
INTRAMUSCULAR | Status: AC
  Filled 2020-02-25: qty 2

## 2020-02-25 MED ORDER — SODIUM CHLORIDE 0.9 % IR SOLN
Status: DC | PRN
  Administered 2020-02-25: 3000 mL

## 2020-02-25 MED ORDER — FENTANYL CITRATE (PF) 100 MCG/2ML IJ SOLN
INTRAMUSCULAR | Status: DC | PRN
Start: 2020-02-25 — End: 2020-02-25
  Administered 2020-02-25 (×2): 50 ug via INTRAVENOUS

## 2020-02-25 MED ORDER — OXYCODONE HCL 5 MG PO TABS
5.0000 mg | ORAL_TABLET | Freq: Once | ORAL | Status: AC | PRN
  Administered 2020-02-25: 5 mg via ORAL

## 2020-02-25 MED ORDER — ACETAMINOPHEN 500 MG PO TABS
ORAL_TABLET | ORAL | Status: AC
  Filled 2020-02-25: qty 2

## 2020-02-25 MED ORDER — LACTATED RINGERS IV SOLN: INTRAVENOUS | Status: DC

## 2020-02-25 MED ORDER — LACTATED RINGERS IV SOLN: INTRAVENOUS | Status: DC | PRN

## 2020-02-25 MED ORDER — OXYCODONE HCL 5 MG PO TABS
ORAL_TABLET | ORAL | Status: AC
  Filled 2020-02-25: qty 1

## 2020-02-25 MED ORDER — LIDOCAINE 2% (20 MG/ML) 5 ML SYRINGE
INTRAMUSCULAR | Status: DC | PRN
  Administered 2020-02-25: 80 mg via INTRAVENOUS

## 2020-02-25 MED ORDER — MIDAZOLAM HCL 5 MG/5ML IJ SOLN
INTRAMUSCULAR | Status: DC | PRN
  Administered 2020-02-25: 2 mg via INTRAVENOUS

## 2020-02-25 MED ORDER — PROPOFOL 10 MG/ML IV BOLUS
INTRAVENOUS | Status: DC | PRN
  Administered 2020-02-25: 160 mg via INTRAVENOUS

## 2020-02-25 MED ORDER — PROMETHAZINE HCL 25 MG/ML IJ SOLN: 6.2500 mg | INTRAMUSCULAR | Status: DC | PRN

## 2020-02-25 MED ORDER — ONDANSETRON HCL 4 MG/2ML IJ SOLN
INTRAMUSCULAR | Status: DC | PRN
  Administered 2020-02-25: 4 mg via INTRAVENOUS

## 2020-02-25 MED ORDER — ACETAMINOPHEN 500 MG PO TABS
1000.0000 mg | ORAL_TABLET | Freq: Once | ORAL | Status: AC
  Administered 2020-02-25: 1000 mg via ORAL

## 2020-02-25 MED ORDER — DEXAMETHASONE SODIUM PHOSPHATE 10 MG/ML IJ SOLN
INTRAMUSCULAR | Status: AC
  Filled 2020-02-25: qty 1

## 2020-02-25 MED ORDER — LIDOCAINE 2% (20 MG/ML) 5 ML SYRINGE
INTRAMUSCULAR | Status: AC
  Filled 2020-02-25: qty 5

## 2020-02-25 MED ORDER — KETOROLAC TROMETHAMINE 30 MG/ML IJ SOLN
INTRAMUSCULAR | Status: DC | PRN
  Administered 2020-02-25: 30 mg via INTRAVENOUS

## 2020-02-25 MED ORDER — KETOROLAC TROMETHAMINE 30 MG/ML IJ SOLN
INTRAMUSCULAR | Status: AC
  Filled 2020-02-25: qty 1

## 2020-02-25 MED ORDER — LIDOCAINE HCL 1 % IJ SOLN
INTRAMUSCULAR | Status: DC | PRN
  Administered 2020-02-25: 10 mL

## 2020-02-25 MED ORDER — EPHEDRINE SULFATE-NACL 50-0.9 MG/10ML-% IV SOSY
PREFILLED_SYRINGE | INTRAVENOUS | Status: DC | PRN
  Administered 2020-02-25: 5 mg via INTRAVENOUS

## 2020-02-25 MED ORDER — OXYCODONE HCL 5 MG/5ML PO SOLN: 5.0000 mg | Freq: Once | ORAL | Status: AC | PRN

## 2020-02-25 MED ORDER — ONDANSETRON HCL 4 MG/2ML IJ SOLN
INTRAMUSCULAR | Status: AC
  Filled 2020-02-25: qty 2

## 2020-02-25 MED ORDER — DEXAMETHASONE SODIUM PHOSPHATE 10 MG/ML IJ SOLN
INTRAMUSCULAR | Status: DC | PRN
  Administered 2020-02-25: 10 mg via INTRAVENOUS

## 2020-02-25 MED ORDER — FENTANYL CITRATE (PF) 100 MCG/2ML IJ SOLN
INTRAMUSCULAR | Status: AC
  Filled 2020-02-25: qty 2

## 2020-02-25 SURGICAL SUPPLY — 19 items
BLADE SURG 11 STRL SS (BLADE) ×2 IMPLANT
CATH ROBINSON RED A/P 16FR (CATHETERS) ×3 IMPLANT
COVER WAND RF STERILE (DRAPES) ×3 IMPLANT
DRSG TEGADERM 4X4.75 (GAUZE/BANDAGES/DRESSINGS) ×2 IMPLANT
GAUZE 4X4 16PLY RFD (DISPOSABLE) ×3 IMPLANT
GLOVE BIO SURGEON STRL SZ 6.5 (GLOVE) ×1 IMPLANT
GLOVE BIO SURGEONS STRL SZ 6.5 (GLOVE) ×1
GLOVE BIOGEL PI IND STRL 6.5 (GLOVE) ×1 IMPLANT
GLOVE BIOGEL PI IND STRL 7.0 (GLOVE) ×1 IMPLANT
GLOVE BIOGEL PI INDICATOR 6.5 (GLOVE) ×2
GLOVE BIOGEL PI INDICATOR 7.0 (GLOVE) ×6
GLOVE ECLIPSE 6.0 STRL STRAW (GLOVE) ×3 IMPLANT
GOWN STRL REUS W/TWL LRG LVL3 (GOWN DISPOSABLE) ×6 IMPLANT
IV NS IRRIG 3000ML ARTHROMATIC (IV SOLUTION) ×3 IMPLANT
KIT PROCEDURE FLUENT (KITS) ×3 IMPLANT
PACK VAGINAL MINOR WOMEN LF (CUSTOM PROCEDURE TRAY) ×3 IMPLANT
PAD OB MATERNITY 4.3X12.25 (PERSONAL CARE ITEMS) ×3 IMPLANT
TOWEL OR 17X26 10 PK STRL BLUE (TOWEL DISPOSABLE) ×3 IMPLANT
WATER STERILE IRR 500ML POUR (IV SOLUTION) ×3 IMPLANT

## 2020-02-25 NOTE — Op Note (Signed)
Operative Note  Pre-operative Diagnosis: post menopausal bleeding  Post-operative Diagnosis: post menopausal bleeding  Surgeon: Marlow Baars, MD  Procedure: hysteroscopy, dilation and curettage  Anesthesia: general  Estimated Blood Loss: 5 mL         Specimens: endometrial curettings          Findings: Anteverted uterus.  Stenosis of external cervix os.  Thin, atrophic endometrium  Description of Procedure:         The patient had known cervical stenosis of the external os that prevented endometrial sampling in the office.  She placed 400 mcg of misoprostol in the vagina four hours prior to the start of the procedure.  After adequate anesthesia was achieved, the patient placed in the dorsal lithotomy position in Sam Rayburn stirrups.  She was prepped and draped in the usual sterile fashion.  The bladder was drained with a catheter.  A bimanual exam revealed an anteverted uterus.  The bivalve speculum was placed in the vagina and the anterior lip of the cervix grasped with a single-tooth tenaculum.  The external cervical os was stenotic with just a small dimple visible.  The smallest Hank's dilator was not able to pass through the os.  An 11 blade scalpel was used to make a cruciate incision over the external os.  The cervix was serially dilated with ease with Hank dilators to 72mm.  Under direct visualization, the hysteroscope was advanced.  The uterine cavity was surveyed. Both tubal ostia were identified. The endometrium was noted to be thin and atrophic.  There was a small thin strip of disrupted endometrium on the posterior uterus adjacent to the internal cervical os that was felt to likely be induced from trauma from cervical dilation.  It did not appear to be a uterine polyp.  Polyp forceps were passed through the hysteroscope instrument channel.  The long strip of endometrium was grasped at the base and removed in its entirely to ensure sampling of this area.  The hysteroscope was removed and a  sharp curettage was performed.  The endometrial curettings were sent to pathology.  All the vaginal instruments were removed.  Counts were correct.  The patient was awakened from anesthesia and transferred to PACU in stable condition.    [place], [facility]

## 2020-02-25 NOTE — Progress Notes (Signed)
ambulated to car, accompained by nurse. Tolerated well . Husband is the driver

## 2020-02-25 NOTE — Discharge Instructions (Signed)
Please take ibuprofen 600mg  every 6 hours and acetaminophen 1000 mg every 6 hours as needed for pain.    Pelvic rest x 2 weeks (no intercourse or tampons).  Call your doctor if you have heavy vaginal bleeding (soaking through a pad an hour or more for >2 hours in a row), temperature >101F, severe nausea, vomiting, severe or worsening abdominal pain, dizziness, shortness of breath, chest pain or any other concerns.     DISCHARGE INSTRUCTIONS: HYSTEROSCOPY /D&C   The following instructions have been prepared to help you care for yourself upon your return home.  May Remove Scop patch on or before  May take Ibuprofen after 630 pm today  May take stool softner while taking narcotic pain medication to prevent constipation.  Drink plenty of water.  Personal hygiene: June Use sanitary pads for vaginal drainage, not tampons. . Shower the day after your procedure. . NO tub baths, pools or Jacuzzis for 2-3 weeks. . Wipe front to back after using the bathroom.  Activity and limitations: . Do NOT drive or operate any equipment for 24 hours. The effects of anesthesia are still present and drowsiness may result. . Do NOT rest in bed all day. . Walking is encouraged. . Walk up and down stairs slowly. . You may resume your normal activity in one to two days or as indicated by your physician. Sexual activity: NO intercourse for at least 2 weeks after the procedure, or as indicated by your Doctor.  Diet: Eat a light meal as desired this evening. You may resume your usual diet tomorrow.  Return to Work: You may resume your work activities in one to two days or as indicated by Marland Kitchen.  What to expect after your surgery: Expect to have vaginal bleeding/discharge for 2-3 days and spotting for up to 10 days. It is not unusual to have soreness for up to 1-2 weeks. You may have a slight burning sensation when you urinate for the first day. Mild cramps may continue for a couple of days. You may have  a regular period in 2-6 weeks.  Call your doctor for any of the following: . Excessive vaginal bleeding or clotting, saturating and changing one pad every hour. . Inability to urinate 6 hours after discharge from hospital. . Pain not relieved by pain medication. . Fever of 100.4 F or greater. . Unusual vaginal discharge or odor.    Post Anesthesia Home Care Instructions  Activity: Get plenty of rest for the remainder of the day. A responsible adult should stay with you for 24 hours following the procedure.  For the next 24 hours, DO NOT: -Drive a car -Therapist, sports -Drink alcoholic beverages -Take any medication unless instructed by your physician -Make any legal decisions or sign important papers.  Meals: Start with liquid foods such as gelatin or soup. Progress to regular foods as tolerated. Avoid greasy, spicy, heavy foods. If nausea and/or vomiting occur, drink only clear liquids until the nausea and/or vomiting subsides. Call your physician if vomiting continues.  Special Instructions/Symptoms: Your throat may feel dry or sore from the anesthesia or the breathing tube placed in your throat during surgery. If this causes discomfort, gargle with warm salt water. The discomfort should disappear within 24 hours.  If you had a scopolamine patch placed behind your ear for the management of post- operative nausea and/or vomiting:  1. The medication in the patch is effective for 72 hours, after which it should be removed.  Wrap patch in a  tissue and discard in the trash. Wash hands thoroughly with soap and water. 2. You may remove the patch earlier than 72 hours if you experience unpleasant side effects which may include dry mouth, dizziness or visual disturbances. 3. Avoid touching the patch. Wash your hands with soap and water after contact with the patch.

## 2020-02-25 NOTE — Anesthesia Procedure Notes (Signed)
Procedure Name: LMA Insertion Date/Time: 02/25/2020 12:12 PM Performed by: Marny Lowenstein, CRNA Pre-anesthesia Checklist: Patient identified, Emergency Drugs available, Suction available and Patient being monitored Patient Re-evaluated:Patient Re-evaluated prior to induction Oxygen Delivery Method: Circle system utilized Preoxygenation: Pre-oxygenation with 100% oxygen Induction Type: IV induction Ventilation: Mask ventilation without difficulty LMA: LMA inserted LMA Size: 4.0 Number of attempts: 1 Airway Equipment and Method: Patient positioned with wedge pillow Placement Confirmation: positive ETCO2 and breath sounds checked- equal and bilateral Tube secured with: Tape Dental Injury: Teeth and Oropharynx as per pre-operative assessment

## 2020-02-25 NOTE — H&P (Signed)
54 y.o. G2P2 presents for hysteroscopy, D&C for postmenopausal bleeding.  She presented to Dr. Aldona Bar for new onset pink spotting in late July. On HRT. Has never had PMB in past. Menopause at age 29. Woke up w RLQ pain in mid-June, went away in a few days. Mild aches in RLQ since that time. She reports a pink tinge over the past week. Last thursday bright red bleeding and increased amount. Pink spotting since that time.  Dr. Aldona Bar attempted EMB, but encountered cervical stenosis so sample was not able to be obtained in office.   Pelvic ultrasound: normal uterus. Endometrium homogeneous, thickened at 4.31mm, but borders are difficult to interpret due to uterine position. Left ovary normal. Right ovary with 1 cm simple cyst, no internal features, no increased blood flow by color doppler. No fluid in cul de sac   Past Medical History:  Diagnosis Date  . CAD (coronary artery disease)   . Hematuria, unspecified    none recent  . Hyperlipidemia   . Hypertension   . PMB (postmenopausal bleeding)   . Pneumonia 08/1998   "bacterial"  . Thyroid nodule    followed by dr Clelia Croft  . UTI (urinary tract infection)    hx of    Past Surgical History:  Procedure Laterality Date  . CERVICAL FUSION  11/2010   plating and cadaver bone  . colonscopy  10/2019   1 benign polyp  . CYST REMOVAL HAND Right before 2006   wrist  . CYSTOSCOPY  yrs ago  . KNEE SURGERY Left 01/2000   knee cap  repair of patella  . left foot surgery  age 44  . TUBAL LIGATION  before 2006  . WISDOM TOOTH EXTRACTION  2009    OB History  Gravida Para Term Preterm AB Living  2         2  SAB TAB Ectopic Multiple Live Births          2    # Outcome Date GA Lbr Len/2nd Weight Sex Delivery Anes PTL Lv  2 Gravida           1 Gravida             Social History   Socioeconomic History  . Marital status: Married    Spouse name: Not on file  . Number of children: Not on file  . Years of education: Not on file  . Highest education  level: Not on file  Occupational History  . Not on file  Tobacco Use  . Smoking status: Never Smoker  . Smokeless tobacco: Never Used  Vaping Use  . Vaping Use: Never used  Substance and Sexual Activity  . Alcohol use: Yes    Comment: occ  . Drug use: No  . Sexual activity: Not on file    Comment: MARRIED  Other Topics Concern  . Not on file  Social History Narrative  . Not on file       Iodine, Shellfish allergy, and Tamiflu [oseltamivir phosphate]    Vitals:   02/25/20 1038  BP: (!) 150/86  Pulse: 80  Resp: 15  Temp: 99.3 F (37.4 C)  SpO2: 97%     General:  NAD Abdomen:  soft     A/P   54 y.o.  G2P2 presents for hysteroscopy, dilation and curettage for postmenopausal bleeding.  Unable to sample in office due to cervical stenosis on initial attempt.  Patient declines repeat attempt at EMB and elects to proceed with surgical  evaluation.  Premedication with misoprostol today.   Discussed risks to include infection, bleeding, damage to surrounding structures (including but not limited to vagina, cervix, bladder, uterus), inability to dilate cervix or fully sample endometrium, uterine perforation, need for additional procedures.  All questions answered and patient elects to proceed.   [facility] [employer]EL [employer]

## 2020-02-25 NOTE — Anesthesia Postprocedure Evaluation (Signed)
Anesthesia Post Note  Patient: Christine Nolan  Procedure(s) Performed: DILATATION AND CURETTAGE /HYSTEROSCOPY (N/A Vagina )     Patient location during evaluation: PACU Anesthesia Type: General Level of consciousness: awake and alert and oriented Pain management: pain level controlled Vital Signs Assessment: post-procedure vital signs reviewed and stable Respiratory status: spontaneous breathing, nonlabored ventilation and respiratory function stable Cardiovascular status: blood pressure returned to baseline Postop Assessment: no apparent nausea or vomiting Anesthetic complications: no   No complications documented.  Last Vitals:  Vitals:   02/25/20 1038 02/25/20 1249  BP: (!) 150/86 130/68  Pulse: 80 75  Resp: 15 15  Temp: 37.4 C (!) 36.3 C  SpO2: 97% 99%    Last Pain:  Vitals:   02/25/20 1249  TempSrc:   PainSc: 0-No pain                 Kaylyn Layer

## 2020-02-25 NOTE — Transfer of Care (Signed)
Immediate Anesthesia Transfer of Care Note  Patient: Christine Nolan  Procedure(s) Performed: DILATATION AND CURETTAGE /HYSTEROSCOPY (N/A Vagina )  Patient Location: PACU  Anesthesia Type:General  Level of Consciousness: awake, alert  and oriented  Airway & Oxygen Therapy: Patient Spontanous Breathing and Patient connected to face mask oxygen  Post-op Assessment: Report given to RN and Post -op Vital signs reviewed and stable  Post vital signs: Reviewed and stable  Last Vitals:  Vitals Value Taken Time  BP 130/68 02/25/20 1249  Temp 36.3 C 02/25/20 1249  Pulse 71 02/25/20 1252  Resp 11 02/25/20 1252  SpO2 100 % 02/25/20 1252  Vitals shown include unvalidated device data.  Last Pain:  Vitals:   02/25/20 1038  TempSrc: Oral  PainSc: 2       Patients Stated Pain Goal: 8 (02/25/20 1038)  Complications: No complications documented.

## 2020-02-28 ENCOUNTER — Encounter (HOSPITAL_BASED_OUTPATIENT_CLINIC_OR_DEPARTMENT_OTHER): Payer: Self-pay | Admitting: Obstetrics

## 2020-02-28 LAB — SURGICAL PATHOLOGY

## 2020-05-03 ENCOUNTER — Ambulatory Visit (INDEPENDENT_AMBULATORY_CARE_PROVIDER_SITE_OTHER): Payer: BC Managed Care – PPO

## 2020-05-03 ENCOUNTER — Ambulatory Visit: Payer: BC Managed Care – PPO | Admitting: Podiatry

## 2020-05-03 ENCOUNTER — Other Ambulatory Visit: Payer: Self-pay

## 2020-05-03 ENCOUNTER — Encounter: Payer: Self-pay | Admitting: Podiatry

## 2020-05-03 DIAGNOSIS — M779 Enthesopathy, unspecified: Secondary | ICD-10-CM

## 2020-05-03 DIAGNOSIS — M2041 Other hammer toe(s) (acquired), right foot: Secondary | ICD-10-CM

## 2020-05-03 DIAGNOSIS — M2042 Other hammer toe(s) (acquired), left foot: Secondary | ICD-10-CM | POA: Diagnosis not present

## 2020-05-03 DIAGNOSIS — M79671 Pain in right foot: Secondary | ICD-10-CM

## 2020-05-03 DIAGNOSIS — M79672 Pain in left foot: Secondary | ICD-10-CM | POA: Diagnosis not present

## 2020-05-03 NOTE — Progress Notes (Signed)
Subjective:   Patient ID: Christine Nolan, female   DOB: 54 y.o.   MRN: [id number]   HPI Patient states that her foot is hurting again very bad and that this just keeps continuing to go on and is only getting short periods of relief and she would like to consider correction of the deformity would like to get 1 foot done in the second foot done by the end of the year.  Patient states that this is become gradually more of an issue for her   ROS      Objective:  Physical Exam  Neurovascular status intact with exquisite discomfort second MPJ right over left with elongated digit second toe bilateral with inflammation around the metatarsal phalangeal joint chronic in nature     Assessment:  Chronic capsulitis second MPJ bilateral that we have done medication for injections and orthotic devices without relief     Plan:  H&P reviewed condition.  At this point I recommended shortening osteotomy along with digital fusion I explained procedures to patient and risk associated with this pain.  Patient wants surgery and was given consent form and after extensive review signed consent form understanding alternative treatments complications and is scheduled for outpatient surgery.  Understands total recovery can take 6 months to 1 year and all risks cannot be discounted and at this point I went ahead and dispensed air fracture walker for the postoperative.  Patient is encouraged to call with questions concerns and is scheduled for surgery  X-rays indicate elongated second metatarsal bilateral with elongated second digit bilateral

## 2020-05-03 NOTE — Patient Instructions (Signed)
Pre-Operative Instructions  Congratulations, you have decided to take an important step towards improving your quality of life.  You can be assured that the doctors and staff at [facility] will be with you every step of the way.  Here are some important things you should know:  1. Plan to be at the surgery center/hospital at least 1 (one) hour prior to your scheduled time, unless otherwise directed by the surgical center/hospital staff.  You must have a responsible adult accompany you, remain during the surgery and drive you home.  Make sure you have directions to the surgical center/hospital to ensure you arrive on time. 2. If you are having surgery at [facility] or [facility] hospitals, you will need a copy of your medical history and physical form from your family physician within one month prior to the date of surgery. We will give you a form for your primary physician to complete.  3. We make every effort to accommodate the date you request for surgery.  However, there are times where surgery dates or times have to be moved.  We will contact you as soon as possible if a change in schedule is required.   4. No aspirin/ibuprofen for one week before surgery.  If you are on aspirin, any non-steroidal anti-inflammatory medications (Mobic, Aleve, Ibuprofen) should not be taken seven (7) days prior to your surgery.  You make take Tylenol for pain prior to surgery.  5. Medications - If you are taking daily heart and blood pressure medications, seizure, reflux, allergy, asthma, anxiety, pain or diabetes medications, make sure you notify the surgery center/hospital before the day of surgery so they can tell you which medications you should take or avoid the day of surgery. 6. No food or drink after midnight the night before surgery unless directed otherwise by surgical center/hospital staff. 7. No alcoholic beverages 24-hours prior to surgery.  No smoking 24-hours prior or 24-hours after  surgery. 8. Wear loose pants or shorts. They should be loose enough to fit over bandages, boots, and casts. 9. Don't wear slip-on shoes. Sneakers are preferred. 10. Bring your boot with you to the surgery center/hospital.  Also bring crutches or a walker if your physician has prescribed it for you.  If you do not have this equipment, it will be provided for you after surgery. 11. If you have not been contacted by the surgery center/hospital by the day before your surgery, call to confirm the date and time of your surgery. 12. Leave-time from work may vary depending on the type of surgery you have.  Appropriate arrangements should be made prior to surgery with your employer. 13. Prescriptions will be provided immediately following surgery by your doctor.  Fill these as soon as possible after surgery and take the medication as directed. Pain medications will not be refilled on weekends and must be approved by the doctor. 14. Remove nail polish on the operative foot and avoid getting pedicures prior to surgery. 15. Wash the night before surgery.  The night before surgery wash the foot and leg well with water and the antibacterial soap provided. Be sure to pay special attention to beneath the toenails and in between the toes.  Wash for at least three (3) minutes. Rinse thoroughly with water and dry well with a towel.  Perform this wash unless told not to do so by your physician.  Enclosed: 1 Ice pack (please put in freezer the night before surgery)   1 Hibiclens skin cleaner     Pre-op instructions  If you have any questions regarding the instructions, please do not hesitate to call our office.  [place]: [address], [place], [place] [address] -- [phone]  [place]: [address] [place], [place] [address] -- [phone]  [place]: [address], [place], [place] [address] -- [phone]   Website: [email or web address] 

## 2020-05-09 ENCOUNTER — Telehealth: Payer: Self-pay

## 2020-05-09 NOTE — Telephone Encounter (Signed)
DOS 05/23/2020  METATARSAL OSTEOTOMY 2ND RT - 28308 HAMMERTOE REPAIR 2ND RT - 28285  BCSB EFFECTIVE DATE - 07/02/2019  PLAN DEDUCTIBLE - $1000.00 W/ $0.00 REMAINING OUT OF POCKET - $3500.00 W/ $0.00 REEMAINING COPAY $0.00 COINSURANCE - 20% PER SERVICE YEAR  NO AUTH REQUIRED PER WEBSITE

## 2020-05-16 ENCOUNTER — Other Ambulatory Visit: Payer: Self-pay | Admitting: Podiatry

## 2020-05-16 DIAGNOSIS — M2041 Other hammer toe(s) (acquired), right foot: Secondary | ICD-10-CM

## 2020-05-16 DIAGNOSIS — M2042 Other hammer toe(s) (acquired), left foot: Secondary | ICD-10-CM

## 2020-05-22 MED ORDER — HYDROCODONE-ACETAMINOPHEN 10-325 MG PO TABS: 1.0000 | ORAL_TABLET | Freq: Four times a day (QID) | ORAL | 0 refills | Status: AC | PRN

## 2020-05-22 NOTE — Addendum Note (Signed)
Addended by: Lenn Sink on: 05/22/2020 05:10 PM   Modules accepted: Orders

## 2020-05-23 ENCOUNTER — Encounter: Payer: Self-pay | Admitting: Podiatry

## 2020-05-23 DIAGNOSIS — Q6689 Other  specified congenital deformities of feet: Secondary | ICD-10-CM | POA: Diagnosis not present

## 2020-05-23 DIAGNOSIS — M21541 Acquired clubfoot, right foot: Secondary | ICD-10-CM | POA: Diagnosis not present

## 2020-05-23 DIAGNOSIS — M2041 Other hammer toe(s) (acquired), right foot: Secondary | ICD-10-CM | POA: Diagnosis not present

## 2020-05-24 ENCOUNTER — Telehealth: Payer: Self-pay | Admitting: Podiatry

## 2020-05-24 NOTE — Telephone Encounter (Signed)
Called pt to see how she's doing after surgery. She isn't experiencing any soreness. She rated her pain a 1-2 out of 10. She has been taking her Rx regularly and experienced minor nausea but feels great today. She says everything looks good and she will be coming for her post op on Monday.

## 2020-05-29 ENCOUNTER — Other Ambulatory Visit: Payer: Self-pay

## 2020-05-29 ENCOUNTER — Ambulatory Visit (INDEPENDENT_AMBULATORY_CARE_PROVIDER_SITE_OTHER): Payer: BC Managed Care – PPO | Admitting: Podiatry

## 2020-05-29 ENCOUNTER — Ambulatory Visit (INDEPENDENT_AMBULATORY_CARE_PROVIDER_SITE_OTHER): Payer: BC Managed Care – PPO

## 2020-05-29 ENCOUNTER — Encounter: Payer: Self-pay | Admitting: Podiatry

## 2020-05-29 DIAGNOSIS — M2041 Other hammer toe(s) (acquired), right foot: Secondary | ICD-10-CM

## 2020-05-29 DIAGNOSIS — M2042 Other hammer toe(s) (acquired), left foot: Secondary | ICD-10-CM

## 2020-05-30 NOTE — Progress Notes (Signed)
Subjective:   Patient ID: Christine Nolan, female   DOB: 54 y.o.   MRN: [id number]   HPI Patient states doing very well with the left foot and able to get around utilizing her boot with minimal discomfort and states she wants to get her right foot fixed end of December and would like to discuss today   ROS      Objective:  Physical Exam  Neurovascular status intact negative Denna Haggard' sign noted wound edges well coapted left good alignment noted good range of motion pin intact second digit with structural deformity right with bunion deformity and chronic discomfort second metatarsal phalangeal joint with digital deformity     Assessment:  Doing well post foot surgery left with good correction with structural deformity right     Plan:  For the left I went ahead and reapplied sterile dressing discussed continued immobilization elevation compression along with anti-inflammatories and for the right I discussed surgical intervention reviewing distal osteotomy digital fusion shortening osteotomy and allow her to read consent form explaining that just because 1 foot did so well there is no guarantees and surgery comes with risk. Patient wants surgery and after extensive review signed consent form scheduled for outpatient procedure  X-rays indicate the osteotomy left is healing well good positional component second digit good alignment noted

## 2020-06-05 ENCOUNTER — Telehealth: Payer: Self-pay | Admitting: Podiatry

## 2020-06-05 NOTE — Telephone Encounter (Signed)
DOS: 06/27/2020  Procedures: Metatarsal Osteotomy 2nd Lt [phone]) & Hammertoe Repair 2nd Lt [phone])  BCBS Effective From: 07/02/2019 - 06/30/2020  Deductible: $1,000 with $1,000 met and $0 remaining. Out of Pocket: $3,500 with $3,500 met and $0 remaining. CoInsurance: 20% Copay: $0  Per [employer] no Prior Authorization is required.

## 2020-06-16 ENCOUNTER — Telehealth: Payer: Self-pay | Admitting: Podiatry

## 2020-06-16 NOTE — Telephone Encounter (Signed)
Pt called stating most of her stitches have dissolved. However, there is a stitch stinking out at the top and the bottom of her incision. She would like to know next steps. Please advise.

## 2020-06-19 NOTE — Telephone Encounter (Signed)
Those will fall off in next couple of weeks

## 2020-06-20 ENCOUNTER — Telehealth: Payer: Self-pay

## 2020-06-20 NOTE — Telephone Encounter (Signed)
Pt called today and stated that she is having some redness around her surgical site.

## 2020-06-22 ENCOUNTER — Encounter: Payer: Self-pay | Admitting: Podiatry

## 2020-06-22 ENCOUNTER — Ambulatory Visit (INDEPENDENT_AMBULATORY_CARE_PROVIDER_SITE_OTHER): Payer: BC Managed Care – PPO | Admitting: Podiatry

## 2020-06-22 ENCOUNTER — Other Ambulatory Visit: Payer: Self-pay

## 2020-06-22 DIAGNOSIS — M79672 Pain in left foot: Secondary | ICD-10-CM | POA: Diagnosis not present

## 2020-06-22 DIAGNOSIS — M2042 Other hammer toe(s) (acquired), left foot: Secondary | ICD-10-CM | POA: Diagnosis not present

## 2020-06-22 DIAGNOSIS — M2041 Other hammer toe(s) (acquired), right foot: Secondary | ICD-10-CM

## 2020-06-22 NOTE — Progress Notes (Signed)
Subjective:   Patient ID: Christine Nolan, female   DOB: 54 y.o.   MRN: [id number]   HPI Patient presents stating that she is doing well but she wanted a little stitch removed and also she wants to talk about fixing the third toe left foot   ROS      Objective:  Physical Exam  Neurovascular status intact negative Denna Haggard' sign noted patient's right foot healing well wound edges well coapted toe in good alignment pin in place with slight states that we excised today.  Left third toe is actually deviated against the second toe and does have buckling at the proximal to phalangeal joint     Assessment:  Doing well right with hammertoe deformity second third left and elongated metatarsal second left      Plan:  Reviewed condition and for the left I went ahead and I recommended digital fusion of digit 3 also and this is added to her consent and I reviewed it with her.  Patient will be seen back next week for surgery encouraged to call with questions concerns

## 2020-06-26 MED ORDER — HYDROCODONE-ACETAMINOPHEN 10-325 MG PO TABS
1.0000 | ORAL_TABLET | Freq: Four times a day (QID) | ORAL | 0 refills | Status: DC | PRN
Start: 2020-06-26 — End: 2020-09-01

## 2020-06-27 ENCOUNTER — Telehealth: Payer: Self-pay | Admitting: Podiatry

## 2020-06-27 DIAGNOSIS — Q6689 Other  specified congenital deformities of feet: Secondary | ICD-10-CM | POA: Diagnosis not present

## 2020-06-27 DIAGNOSIS — M2041 Other hammer toe(s) (acquired), right foot: Secondary | ICD-10-CM | POA: Diagnosis not present

## 2020-06-27 DIAGNOSIS — M21542 Acquired clubfoot, left foot: Secondary | ICD-10-CM

## 2020-06-27 DIAGNOSIS — M2042 Other hammer toe(s) (acquired), left foot: Secondary | ICD-10-CM

## 2020-06-27 DIAGNOSIS — Z472 Encounter for removal of internal fixation device: Secondary | ICD-10-CM | POA: Diagnosis not present

## 2020-06-27 NOTE — Telephone Encounter (Signed)
Patient called and said she has bled a lot through her bandage. I asked her to take a picture and send it to me (per [facility]). I have not received the picture yet, but she probably needs someone to call her. She said she noticed the bleeding when she adjusted her bandage.

## 2020-06-27 NOTE — Telephone Encounter (Signed)
I spoke with the patient and we actually Facetimed. There if blood on the bandage from the toes going proximal to the metatarsal head and is fresh blood. Encouraged elevation and she marked the area to make sure it is not extending. She was worried about keeping the bloody bandage on until next week. She is scheduled to see Dr. Charlsie Merles tomorrow.

## 2020-06-28 ENCOUNTER — Encounter: Payer: Self-pay | Admitting: Podiatry

## 2020-06-28 ENCOUNTER — Other Ambulatory Visit: Payer: Self-pay

## 2020-06-28 ENCOUNTER — Ambulatory Visit: Payer: BC Managed Care – PPO | Admitting: Podiatry

## 2020-06-28 DIAGNOSIS — M2042 Other hammer toe(s) (acquired), left foot: Secondary | ICD-10-CM | POA: Diagnosis not present

## 2020-06-28 DIAGNOSIS — M2041 Other hammer toe(s) (acquired), right foot: Secondary | ICD-10-CM

## 2020-06-28 NOTE — Progress Notes (Signed)
Subjective:   Patient ID: Christine Nolan, female   DOB: 54 y.o.   MRN: [id number]   HPI Patient presents stating she was concerned about some bleeding in her left bandage from surgery yesterday and also is concerned her second toe slightly up in the air   ROS      Objective:  Physical Exam  Neurovascular status intact negative Denna Haggard' sign noted with localized bleeding left but it has appeared to stop currently and is drying out with mild elevation second digit right     Assessment:  Acceptable amount of bleeding left with elevation but should completely stop and I do not want to take dressing off at this time because of swelling and chance for infection and for the right I did dispense a brace to both reduce swelling around the foot ankle and lower the second toe and this was explained as far as how to use it properly     Plan:  Above explanation

## 2020-06-28 NOTE — Telephone Encounter (Signed)
Thank you for talking to her. 

## 2020-07-03 ENCOUNTER — Other Ambulatory Visit: Payer: Self-pay

## 2020-07-03 ENCOUNTER — Ambulatory Visit (INDEPENDENT_AMBULATORY_CARE_PROVIDER_SITE_OTHER): Payer: BC Managed Care – PPO

## 2020-07-03 ENCOUNTER — Encounter: Payer: Self-pay | Admitting: Podiatry

## 2020-07-03 ENCOUNTER — Ambulatory Visit (INDEPENDENT_AMBULATORY_CARE_PROVIDER_SITE_OTHER): Payer: BC Managed Care – PPO | Admitting: Podiatry

## 2020-07-03 DIAGNOSIS — M79672 Pain in left foot: Secondary | ICD-10-CM

## 2020-07-04 NOTE — Progress Notes (Signed)
Subjective:   Patient ID: Christine Nolan, female   DOB: 55 y.o.   MRN: [id number]   HPI Patient presents stating that she is doing well and states the bleeding stopped and that she is having only mild pain at the current time   ROS      Objective:  Physical Exam  Neuro vascular status intact negative Denna Haggard' sign noted digits 2 3 left hip pins that are present and are doing well the toes are in good alignment wound edges well coapted     Assessment:  Doing well post forefoot reconstruction left     Plan:  H&P x-rays reviewed sterile dressings reapplied continue to lower the toes explaining how to do this and continue range of motion exercises.  Continue immobilization elevation compression  X-rays indicate pins are in proper alignment screw in proper alignment good positional component of all procedures

## 2020-07-17 ENCOUNTER — Encounter: Payer: BC Managed Care – PPO | Admitting: Podiatry

## 2020-07-19 ENCOUNTER — Other Ambulatory Visit: Payer: Self-pay

## 2020-07-19 ENCOUNTER — Ambulatory Visit (INDEPENDENT_AMBULATORY_CARE_PROVIDER_SITE_OTHER): Payer: BC Managed Care – PPO

## 2020-07-19 ENCOUNTER — Encounter: Payer: Self-pay | Admitting: Podiatry

## 2020-07-19 ENCOUNTER — Ambulatory Visit (INDEPENDENT_AMBULATORY_CARE_PROVIDER_SITE_OTHER): Payer: BC Managed Care – PPO | Admitting: Podiatry

## 2020-07-19 DIAGNOSIS — M79672 Pain in left foot: Secondary | ICD-10-CM

## 2020-07-19 DIAGNOSIS — M2042 Other hammer toe(s) (acquired), left foot: Secondary | ICD-10-CM | POA: Diagnosis not present

## 2020-07-19 NOTE — Progress Notes (Signed)
Subjective:   Patient ID: Christine Nolan, female   DOB: 55 y.o.   MRN: [id number]   HPI Patient states she is doing well with still some discomfort but overall continues to improve   ROS      Objective:  Physical Exam  Vascular status intact negative Denna Haggard' sign noted wound edges well coapted left some irritation in the incision probably due to the cold weather with pins in place second and third toes     Assessment:  Doing well post foot surgery left     Plan:  H&P reviewed condition stitches removed wound edges coapted well reapplied sterile dressing instructed on keeping the toes plantarflexed and reappoint 2 weeks pin removal and x-ray both feet at that time  X-rays indicate the pins are in place second and third toes left screws in place second metatarsal good alignment noted

## 2020-08-02 ENCOUNTER — Encounter: Payer: Self-pay | Admitting: Podiatry

## 2020-08-02 ENCOUNTER — Other Ambulatory Visit: Payer: Self-pay

## 2020-08-02 ENCOUNTER — Ambulatory Visit (INDEPENDENT_AMBULATORY_CARE_PROVIDER_SITE_OTHER): Payer: BC Managed Care – PPO

## 2020-08-02 ENCOUNTER — Ambulatory Visit (INDEPENDENT_AMBULATORY_CARE_PROVIDER_SITE_OTHER): Payer: BC Managed Care – PPO | Admitting: Podiatry

## 2020-08-02 DIAGNOSIS — M79672 Pain in left foot: Secondary | ICD-10-CM

## 2020-08-02 DIAGNOSIS — M79671 Pain in right foot: Secondary | ICD-10-CM

## 2020-08-02 NOTE — Progress Notes (Signed)
Subjective:   Patient ID: Christine Nolan, female   DOB: 55 y.o.   MRN: [id number]   HPI Patient states she is doing very well ready to get her pins removed   ROS      Objective:  Physical Exam  Neurovascular status intact negative Homans sign noted with pins intact second and third digit left good alignment of the toes     Assessment:  Doing well after having foot surgery bilateral     Plan:  H&P reviewed condition and recommended removal of pins which was accomplished with sterile dressings applied.  I then discussed continued range of motion exercises I reviewed gradual return to soft shoe gear and dispensed ankle compression stocking.  Reappoint to recheck in the next 6 weeks or earlier if needed  X-rays indicate that the digits are in good alignment metatarsals good alignment fixation good

## 2020-08-04 ENCOUNTER — Other Ambulatory Visit: Payer: Self-pay

## 2020-08-04 ENCOUNTER — Ambulatory Visit (INDEPENDENT_AMBULATORY_CARE_PROVIDER_SITE_OTHER): Payer: BC Managed Care – PPO

## 2020-08-04 ENCOUNTER — Ambulatory Visit (INDEPENDENT_AMBULATORY_CARE_PROVIDER_SITE_OTHER): Payer: BC Managed Care – PPO | Admitting: Podiatry

## 2020-08-04 DIAGNOSIS — M2041 Other hammer toe(s) (acquired), right foot: Secondary | ICD-10-CM

## 2020-08-04 DIAGNOSIS — M2042 Other hammer toe(s) (acquired), left foot: Secondary | ICD-10-CM

## 2020-08-04 DIAGNOSIS — L089 Local infection of the skin and subcutaneous tissue, unspecified: Secondary | ICD-10-CM | POA: Diagnosis not present

## 2020-08-04 MED ORDER — DOXYCYCLINE HYCLATE 100 MG PO TABS: 100.0000 mg | ORAL_TABLET | Freq: Two times a day (BID) | ORAL | 0 refills | Status: DC

## 2020-08-08 ENCOUNTER — Encounter: Payer: Self-pay | Admitting: Podiatry

## 2020-08-08 NOTE — Progress Notes (Signed)
Subjective:  Patient ID: Christine Nolan, female    DOB: [birth year],  MRN: [id number]  Chief Complaint  Patient presents with  . Foot Pain    Pt stated that she had her pins removed Wednesday morning and last night she noticed some redness and then this morning she woke up with swelling and her skin was warm to the touch     55 y.o. female presents with the above complaint.  Patient presents with concern for left third digit infection/redness.  Patient had her K wire pin removed by Dr. Charlsie Merles.  She states that she started noticing redness last night has been swollen and warm to touch.  She had the pins pulled out last Wednesday morning.  She denies any other acute complaint she does not have any systemic signs of infection.  She wanted to get it evaluated make sure everything is okay.  Her date of surgery was 05/23/2020.   Review of Systems: Negative except as noted in the HPI. Denies N/V/F/Ch.  Past Medical History:  Diagnosis Date  . CAD (coronary artery disease)   . Hematuria, unspecified    none recent  . Hyperlipidemia   . Hypertension   . PMB (postmenopausal bleeding)   . Pneumonia 08/1998   "bacterial"  . Thyroid nodule    followed by dr Clelia Croft  . UTI (urinary tract infection)    hx of    Current Outpatient Medications:  .  doxycycline (VIBRA-TABS) 100 MG tablet, Take 1 tablet (100 mg total) by mouth 2 (two) times daily., Disp: 28 tablet, Rfl: 0 .  acetaminophen (TYLENOL) 500 MG tablet, Take 1,000 mg by mouth every 6 (six) hours as needed., Disp: , Rfl:  .  atorvastatin (LIPITOR) 40 MG tablet, Take 40 mg by mouth every evening. , Disp: , Rfl:  .  bisacodyl (DULCOLAX) 5 MG EC tablet, Dulcolax (bisacodyl) 5 mg tablet,delayed release  AS DIRECTED, Disp: , Rfl:  .  Calcium Carbonate-Vitamin D3 (CALCIUM 600/VITAMIN D) 600-400 MG-UNIT TABS, Take 1 tablet by mouth every evening. , Disp: , Rfl:  .  dimenhyDRINATE (DRAMAMINE) 50 MG tablet, Take 50 mg by mouth every 8 (eight) hours as  needed., Disp: , Rfl:  .  diphenhydrAMINE (BENADRYL) 25 MG tablet, Take 25 mg by mouth every 6 (six) hours as needed., Disp: , Rfl:  .  estradiol (ESTRACE) 2 MG tablet, Take 2 mg by mouth every evening. , Disp: , Rfl:  .  HYDROcodone-acetaminophen (NORCO) 10-325 MG tablet, Take 1 tablet by mouth every 6 (six) hours as needed., Disp: 30 tablet, Rfl: 0 .  LORazepam (ATIVAN) 0.5 MG tablet, lorazepam 0.5 mg tablet  TAKE 1/2 TO 1 TABLET TWICE DAILY AS NEEDED FOR ANXIETY, Disp: , Rfl:  .  medroxyPROGESTERone (PROVERA) 2.5 MG tablet, Take 2.5 mg by mouth at bedtime. , Disp: , Rfl: 3 .  metoprolol succinate (TOPROL-XL) 50 MG 24 hr tablet, Take 50 mg by mouth every evening. Take with or immediately following a meal. , Disp: , Rfl:  .  misoprostol (CYTOTEC) 200 MCG tablet, misoprostol 200 mcg tablet  PLACE 2 TABS PER VAGINA 4 HOURS PRIOR TO SURGERY, Disp: , Rfl:  .  Multiple Vitamins-Minerals (MULTIVITAMIN ADULT PO), Take 1 tablet by mouth daily. , Disp: , Rfl:  .  Omega-3 Fatty Acids (FISH OIL) 1200 MG CPDR, Take by mouth daily., Disp: , Rfl:  .  polyethylene glycol (GOLYTELY) 236 g solution, peg 3350-electrolytes 236 gram-22.74 gram-6.74 gram-5.86 gram solution  MIX AND DRINK AS  DIRECTED, Disp: , Rfl:  .  sertraline (ZOLOFT) 50 MG tablet, Take 50 mg by mouth every evening. , Disp: , Rfl:  .  trimethoprim (TRIMPEX) 100 MG tablet, Take 100 mg by mouth daily., Disp: , Rfl: 11  Social History   Tobacco Use  Smoking Status Never Smoker  Smokeless Tobacco Never Used    Allergies  Allergen Reactions  . Iodine Anaphylaxis    From foods  . Shellfish Allergy Swelling    EYES SWELLING AND THROAT TIGHTEN in food  . Tamiflu [Oseltamivir Phosphate] Diarrhea and Other (See Comments)    ABD PAIN   Objective:  There were no vitals filed for this visit. There is no height or weight on file to calculate BMI. Constitutional Well developed. Well nourished.  Vascular Dorsalis pedis pulses palpable  bilaterally. Posterior tibial pulses palpable bilaterally. Capillary refill normal to all digits.  No cyanosis or clubbing noted. Pedal hair growth normal.  Neurologic Normal speech. Oriented to person, place, and time. Epicritic sensation to light touch grossly present bilaterally.  Dermatologic Nails well groomed and normal in appearance. No open wounds. No skin lesions.  Orthopedic:  Erythema noted to the left third digit.  No purulent drainage was expressed.  The pin site has reepithelialized.  No wound noted.   Radiographs: 3 views of skeletally mature adult left foot: No signs of osteomyelitis noted.  No soft tissue emphysema noted.  Good correction alignment noted of the third toe. Assessment:   1. Hammer toes of both feet   2. Soft tissue infection    Plan:  Patient was evaluated and treated and all questions answered.  Left third digit soft tissue infection -I explained to the patient the etiology of soft tissue infection versus treatment options were discussed.  Given the amount of redness she is having I believe she will benefit from 14 days of doxycycline for skin and soft tissue prophylaxis.  I encouraged her to complete the entire course.  There is no other clinical signs of infection including purulent drainage.  The pin site is also reepithelialized. -Doxycycline was sent for 14 days. -If there is worsening redness, have asked her to go to the emergency room or come see [place] right away.  Patient states understanding  No follow-ups on file.

## 2020-08-16 DIAGNOSIS — E782 Mixed hyperlipidemia: Secondary | ICD-10-CM | POA: Diagnosis not present

## 2020-08-16 DIAGNOSIS — E041 Nontoxic single thyroid nodule: Secondary | ICD-10-CM | POA: Diagnosis not present

## 2020-08-16 DIAGNOSIS — Z0000 Encounter for general adult medical examination without abnormal findings: Secondary | ICD-10-CM | POA: Diagnosis not present

## 2020-08-16 DIAGNOSIS — R319 Hematuria, unspecified: Secondary | ICD-10-CM | POA: Diagnosis not present

## 2020-08-16 DIAGNOSIS — I10 Essential (primary) hypertension: Secondary | ICD-10-CM | POA: Diagnosis not present

## 2020-08-21 ENCOUNTER — Ambulatory Visit (INDEPENDENT_AMBULATORY_CARE_PROVIDER_SITE_OTHER): Payer: BC Managed Care – PPO | Admitting: Podiatry

## 2020-08-21 ENCOUNTER — Other Ambulatory Visit: Payer: Self-pay

## 2020-08-21 ENCOUNTER — Encounter: Payer: Self-pay | Admitting: Podiatry

## 2020-08-21 ENCOUNTER — Ambulatory Visit (INDEPENDENT_AMBULATORY_CARE_PROVIDER_SITE_OTHER): Payer: BC Managed Care – PPO

## 2020-08-21 DIAGNOSIS — L089 Local infection of the skin and subcutaneous tissue, unspecified: Secondary | ICD-10-CM

## 2020-08-21 DIAGNOSIS — M2042 Other hammer toe(s) (acquired), left foot: Secondary | ICD-10-CM

## 2020-08-21 DIAGNOSIS — M2041 Other hammer toe(s) (acquired), right foot: Secondary | ICD-10-CM

## 2020-08-21 MED ORDER — DOXYCYCLINE HYCLATE 100 MG PO TABS: 100.0000 mg | ORAL_TABLET | Freq: Two times a day (BID) | ORAL | 0 refills | Status: DC

## 2020-08-22 NOTE — Progress Notes (Signed)
Subjective:   Patient ID: Christine Nolan, female   DOB: 54 y.o.   MRN: [id number]   HPI Patient presents with some concerns about the third toe on her left foot that had turned red several weeks ago and she had been on an antibiotic.  States the redness is gone down quite a bit but she wants it checked again   ROS      Objective:  Physical Exam  Neurovascular status intact with moderate swelling third digit left localized no drainage noted currently or proximal edema erythema.  The second digit is slightly red but not to the same degree     Assessment:  Possibility for localized infection third digit left after having had digital fusion with pin     Plan:  H&P education rendered x-ray reviewed.  Today we will get a continue on doxycycline for 2 more weeks and I do think this will heal uneventfully but will need to be watched and I reviewed the x-ray has some lucency at the inner phalangeal joint but I am hoping it will heal and at this position  X-rays indicate lucency of the inner phalangeal joint left third digit and possibility for osteomyelitic changes versus a healing process.  Will review again in 2 weeks and encouraged her to call if any questions concerns were to arise

## 2020-08-31 NOTE — Progress Notes (Signed)
Cardiology Office Note   Date:  09/01/2020   ID:  Christine Nolan, DOB September 28, 1965, MRN [id number]  PCP:  Lupita Raider, MD    No chief complaint on file.  Coronary calcification  Wt Readings from Last 3 Encounters:  09/01/20 173 lb 6.4 oz (78.7 kg)  02/25/20 167 lb 12.8 oz (76.1 kg)  09/06/19 167 lb 3.2 oz (75.8 kg)       History of Present Illness: Christine Nolan is a 55 y.o. female    Who had a CT scan in 2018 whichshowed atherosclerosis in the aorta and coronary arteries, with aortic valve calcification. She has family h/o CAD. Her daughter recently had SCAD.   Father with AFib and leaky valve. Mother had CAD in her mid 90s with CABG. She was overweight.   She had some GERD sx in 3/18 prompting a treadmill. She was stressed at the time as well.   She had a stress test in 3/18:  The patient walked for 6:30 of a standard Bruce protocol. She achieved a peak HR of 148 which is 88% predicted maximal HR .  There was insignificant nonspecific ST depression in the lateral leads which resolved quickly in the recovery phase. These ST changes were likely due to to the hypertensive response .  Blood pressure demonstrated a hypertensive response to exercise. Negative GXT  Repeat GXT in 2019 was negative.  She has been on Lexapro and Ativan for anxiety.   Since the last visit, she has been working remotely.  She has had a lot of stress at work.  SHe had a panic attack in Jan 2021.    Since the last visit, she has had bilateral foot surgery for hammer toes.  Had an infection in one of the sites.   Denies : Chest pain. Dizziness. Leg edema. Nitroglycerin use. Orthopnea. Palpitations. Paroxysmal nocturnal dyspnea. Shortness of breath. Syncope.      Past Medical History:  Diagnosis Date  . CAD (coronary artery disease)   . Hematuria, unspecified    none recent  . Hyperlipidemia   . Hypertension   . PMB (postmenopausal bleeding)   . Pneumonia 08/1998    "bacterial"  . Thyroid nodule    followed by dr Clelia Croft  . UTI (urinary tract infection)    hx of    Past Surgical History:  Procedure Laterality Date  . CERVICAL FUSION  11/2010   plating and cadaver bone  . colonscopy  10/2019   1 benign polyp  . CYST REMOVAL HAND Right before 2006   wrist  . CYSTOSCOPY  yrs ago  . HYSTEROSCOPY WITH D & C N/A 02/25/2020   Procedure: DILATATION AND CURETTAGE /HYSTEROSCOPY;  Surgeon: Marlow Baars, MD;  Location: [facility];  Service: Gynecology;  Laterality: N/A;  . KNEE SURGERY Left 01/2000   knee cap  repair of patella  . left foot surgery  age 48  . TUBAL LIGATION  before 2006  . WISDOM TOOTH EXTRACTION  2009     Current Outpatient Medications  Medication Sig Dispense Refill  . acetaminophen (TYLENOL) 500 MG tablet Take 1,000 mg by mouth every 6 (six) hours as needed.    Marland Kitchen atorvastatin (LIPITOR) 40 MG tablet Take 40 mg by mouth every evening.     . Calcium Carbonate-Vitamin D3 600-400 MG-UNIT TABS Take 1 tablet by mouth every evening.     . dimenhyDRINATE (DRAMAMINE) 50 MG tablet Take 50 mg by mouth every 8 (eight) hours as needed.    Marland Kitchen  diphenhydrAMINE (BENADRYL) 25 MG tablet Take 25 mg by mouth every 6 (six) hours as needed.    . doxycycline (VIBRA-TABS) 100 MG tablet Take 1 tablet (100 mg total) by mouth 2 (two) times daily. 28 tablet 0  . doxycycline (VIBRA-TABS) 100 MG tablet Take 1 tablet (100 mg total) by mouth 2 (two) times daily. 30 tablet 0  . estradiol (ESTRACE) 2 MG tablet Take 2 mg by mouth every evening.     Marland Kitchen LORazepam (ATIVAN) 0.5 MG tablet lorazepam 0.5 mg tablet  TAKE 1/2 TO 1 TABLET TWICE DAILY AS NEEDED FOR ANXIETY    . medroxyPROGESTERone (PROVERA) 2.5 MG tablet Take 2.5 mg by mouth at bedtime.   3  . metoprolol succinate (TOPROL-XL) 50 MG 24 hr tablet Take 50 mg by mouth every evening. Take with or immediately following a meal.    . Multiple Vitamins-Minerals (MULTIVITAMIN ADULT PO) Take 1 tablet by mouth  daily.     . Omega-3 Fatty Acids (FISH OIL) 1200 MG CPDR Take by mouth daily.    . sertraline (ZOLOFT) 50 MG tablet Take 50 mg by mouth every evening.     . trimethoprim (TRIMPEX) 100 MG tablet Take 100 mg by mouth daily.  11   No current facility-administered medications for this visit.    Allergies:   Iodine, Shellfish allergy, and Tamiflu [oseltamivir phosphate]    Social History:  The patient  reports that she has never smoked. She has never used smokeless tobacco. She reports current alcohol use. She reports that she does not use drugs.   Family History:  The patient's family history includes Aortic aneurysm in her father, paternal grandfather, and paternal grandmother; Heart attack (age of onset: 41) in her daughter; Heart attack (age of onset: 41) in her mother; Heart disease in her father.    ROS:  Please see the history of present illness.   Otherwise, review of systems are positive for foot pain.   All other systems are reviewed and negative.    PHYSICAL EXAM: VS:  BP (!) 144/86   Pulse 72   Ht 5' 8.25" (1.734 m)   Wt 173 lb 6.4 oz (78.7 kg)   LMP 07/01/2017   SpO2 90%   BMI 26.17 kg/m  , BMI Body mass index is 26.17 kg/m. GEN: Well nourished, well developed, in no acute distress  HEENT: normal  Neck: no JVD, carotid bruits, or masses Cardiac: RRR; no murmurs, rubs, or gallops,no edema  Respiratory:  clear to auscultation bilaterally, normal work of breathing GI: soft, nontender, nondistended, + BS MS: no deformity or atrophy  Skin: warm and dry, no rash Neuro:  Strength and sensation are intact Psych: euthymic mood, full affect   EKG:   The ekg ordered today demonstrates normal ECG   Recent Labs: 02/25/2020: BUN 16; Creatinine, Ser 0.90; Hemoglobin 13.9; Platelets 226; Potassium 4.0; Sodium 140   Lipid Panel No results found for: CHOL, TRIG, HDL, CHOLHDL, VLDL, LDLCALC, LDLDIRECT   Other studies Reviewed: Additional studies/ records that were reviewed  today with results demonstrating: labs reviewed.   ASSESSMENT AND PLAN:  1. Coronary artery calcification: No angina.  Exercise has been limited of late. May need to consider starting baby aspirin in the fture.  No bleeding problems.  Will hold off now due to recent foot surgery/infection. 2. Hyperlipidemia: LDL 93.  Continue statin.  Whole food, plant-based diet. 3. HTN: The current medical regimen is effective;  continue present plan and medications.  High today, but  home readings are controlled.  4. Aortic atherosclerosis: Continue atorvastatin. No AAA in 2018.   Current medicines are reviewed at length with the patient today.  The patient concerns regarding her medicines were addressed.  The following changes have been made:  No change  Labs/ tests ordered today include:  No orders of the defined types were placed in this encounter.   Recommend 150 minutes/week of aerobic exercise Low fat, low carb, high fiber diet recommended  Disposition:   FU in 1 year   Signed, Lance Muss, MD  09/01/2020 8:35 AM    [facility] [address] [place], [place], [place]  [address] Phone: [phone]; Fax: [phone]

## 2020-09-01 ENCOUNTER — Encounter: Payer: Self-pay | Admitting: Interventional Cardiology

## 2020-09-01 ENCOUNTER — Ambulatory Visit: Payer: BC Managed Care – PPO | Admitting: Interventional Cardiology

## 2020-09-01 ENCOUNTER — Other Ambulatory Visit: Payer: Self-pay

## 2020-09-01 VITALS — BP 144/86 | HR 72 | Ht 68.25 in | Wt 173.4 lb

## 2020-09-01 DIAGNOSIS — I10 Essential (primary) hypertension: Secondary | ICD-10-CM | POA: Diagnosis not present

## 2020-09-01 DIAGNOSIS — I2510 Atherosclerotic heart disease of native coronary artery without angina pectoris: Secondary | ICD-10-CM

## 2020-09-01 DIAGNOSIS — E782 Mixed hyperlipidemia: Secondary | ICD-10-CM | POA: Diagnosis not present

## 2020-09-01 NOTE — Patient Instructions (Addendum)
Medication Instructions:  Your physician recommends that you continue on your current medications as directed. Please refer to the Current Medication list given to you today.  *If you need a refill on your cardiac medications before your next appointment, please call your pharmacy*   Lab Work: none If you have labs (blood work) drawn today and your tests are completely normal, you will receive your results only by: . MyChart Message (if you have MyChart) OR . A paper copy in the mail If you have any lab test that is abnormal or we need to change your treatment, we will call you to review the results.   Testing/Procedures: none   Follow-Up: At [facility], you and your health needs are our priority.  As part of our continuing mission to provide you with exceptional heart care, we have created designated Provider Care Teams.  These Care Teams include your primary Cardiologist (physician) and Advanced Practice Providers (APPs -  Physician Assistants and Nurse Practitioners) who all work together to provide you with the care you need, when you need it.  We recommend signing up for the patient portal called "MyChart".  Sign up information is provided on this After Visit Summary.  MyChart is used to connect with patients for Virtual Visits (Telemedicine).  Patients are able to view lab/test results, encounter notes, upcoming appointments, etc.  Non-urgent messages can be sent to your provider as well.   To learn more about what you can do with MyChart, go to [email or web address].    Your next appointment:   12 month(s)  The format for your next appointment:   In Person  Provider:   You may see Jayadeep Varanasi, MD or one of the following Advanced Practice Providers on your designated Care Team:    Dayna Dunn, PA-C  Michele Lenze, PA-C    Other Instructions  High-Fiber Eating Plan Fiber, also called dietary fiber, is a type of carbohydrate. It is found foods such as fruits,  vegetables, whole grains, and beans. A high-fiber diet can have many health benefits. Your health care provider may recommend a high-fiber diet to help:  Prevent constipation. Fiber can make your bowel movements more regular.  Lower your cholesterol.  Relieve the following conditions: ? Inflammation of veins in the anus (hemorrhoids). ? Inflammation of specific areas of the digestive tract (uncomplicated diverticulosis). ? A problem of the large intestine, also called the colon, that sometimes causes pain and diarrhea (irritable bowel syndrome, or IBS).  Prevent overeating as part of a weight-loss plan.  Prevent heart disease, type 2 diabetes, and certain cancers. What are tips for following this plan? Reading food labels  Check the nutrition facts label on food products for the amount of dietary fiber. Choose foods that have 5 grams of fiber or more per serving.  The goals for recommended daily fiber intake include: ? Men (age 50 or younger): 34-38 g. ? Men (over age 50): 28-34 g. ? Women (age 50 or younger): 25-28 g. ? Women (over age 50): 22-25 g. Your daily fiber goal is _____________ g.   Shopping  Choose whole fruits and vegetables instead of processed forms, such as apple juice or applesauce.  Choose a wide variety of high-fiber foods such as avocados, lentils, oats, and kidney beans.  Read the nutrition facts label of the foods you choose. Be aware of foods with added fiber. These foods often have high sugar and sodium amounts per serving. Cooking  Use whole-grain flour for baking and cooking.    Cook with brown rice instead of white rice. Meal planning  Start the day with a breakfast that is high in fiber, such as a cereal that contains 5 g of fiber or more per serving.  Eat breads and cereals that are made with whole-grain flour instead of refined flour or white flour.  Eat brown rice, bulgur wheat, or millet instead of white rice.  Use beans in place of meat in  soups, salads, and pasta dishes.  Be sure that half of the grains you eat each day are whole grains. General information  You can get the recommended daily intake of dietary fiber by: ? Eating a variety of fruits, vegetables, grains, nuts, and beans. ? Taking a fiber supplement if you are not able to take in enough fiber in your diet. It is better to get fiber through food than from a supplement.  Gradually increase how much fiber you consume. If you increase your intake of dietary fiber too quickly, you may have bloating, cramping, or gas.  Drink plenty of water to help you digest fiber.  Choose high-fiber snacks, such as berries, raw vegetables, nuts, and popcorn. What foods should I eat? Fruits Berries. Pears. Apples. Oranges. Avocado. Prunes and raisins. Dried figs. Vegetables Sweet potatoes. Spinach. Kale. Artichokes. Cabbage. Broccoli. Cauliflower. Green peas. Carrots. Squash. Grains Whole-grain breads. Multigrain cereal. Oats and oatmeal. Brown rice. Barley. Bulgur wheat. Millet. Quinoa. Bran muffins. Popcorn. Rye wafer crackers. Meats and other proteins Navy beans, kidney beans, and pinto beans. Soybeans. Split peas. Lentils. Nuts and seeds. Dairy Fiber-fortified yogurt. Beverages Fiber-fortified soy milk. Fiber-fortified orange juice. Other foods Fiber bars. The items listed above may not be a complete list of recommended foods and beverages. Contact a dietitian for more information. What foods should I avoid? Fruits Fruit juice. Cooked, strained fruit. Vegetables Fried potatoes. Canned vegetables. Well-cooked vegetables. Grains White bread. Pasta made with refined flour. White rice. Meats and other proteins Fatty cuts of meat. Fried chicken or fried fish. Dairy Milk. Yogurt. Cream cheese. Sour cream. Fats and oils Butters. Beverages Soft drinks. Other foods Cakes and pastries. The items listed above may not be a complete list of foods and beverages to avoid.  Talk with your dietitian about what choices are best for you. Summary  Fiber is a type of carbohydrate. It is found in foods such as fruits, vegetables, whole grains, and beans.  A high-fiber diet has many benefits. It can help to prevent constipation, lower blood cholesterol, aid weight loss, and reduce your risk of heart disease, diabetes, and certain cancers.  Increase your intake of fiber gradually. Increasing fiber too quickly may cause cramping, bloating, and gas. Drink plenty of water while you increase the amount of fiber you consume.  The best sources of fiber include whole fruits and vegetables, whole grains, nuts, seeds, and beans. This information is not intended to replace advice given to you by your health care provider. Make sure you discuss any questions you have with your health care provider. Document Revised: 10/21/2019 Document Reviewed: 10/21/2019 Elsevier Patient Education  2021 Elsevier Inc.   

## 2020-09-06 ENCOUNTER — Other Ambulatory Visit: Payer: Self-pay

## 2020-09-06 ENCOUNTER — Ambulatory Visit (INDEPENDENT_AMBULATORY_CARE_PROVIDER_SITE_OTHER): Payer: BC Managed Care – PPO

## 2020-09-06 ENCOUNTER — Ambulatory Visit (INDEPENDENT_AMBULATORY_CARE_PROVIDER_SITE_OTHER): Payer: BC Managed Care – PPO | Admitting: Podiatry

## 2020-09-06 ENCOUNTER — Encounter: Payer: Self-pay | Admitting: Podiatry

## 2020-09-06 DIAGNOSIS — M79672 Pain in left foot: Secondary | ICD-10-CM

## 2020-09-06 NOTE — Progress Notes (Signed)
Subjective:   Patient ID: Christine Nolan, female   DOB: 55 y.o.   MRN: [id number]   HPI Patient states her toe seems to be improving but it still moderately swollen and she wanted it checked.  She has stopped her antibiotic   ROS      Objective:  Physical Exam  Neurovascular status intact negative Denna Haggard' sign noted wound edges well coapted second third digits left with still mild swelling third digit but improved from previous with no erythema or proximal edema noted currently     Assessment:  Stress on the third digit left with possibility for bone issues but appears to be doing much better     Plan:  H&P x-ray reviewed discussed that it appears to be on a good course and I do think will heal uneventfully but I do want her to continue to watch this and I want a recheck again 1 more time in 4 weeks.  Encouraged her to call with questions concerns prior to that event  X-rays indicate the toe is no longer showing any signs of osteolysis appears to be in good position and will be watched with screw replaced second metatarsal.  Also discussed orthotics and we will make these at next visit

## 2020-09-19 DIAGNOSIS — R350 Frequency of micturition: Secondary | ICD-10-CM | POA: Diagnosis not present

## 2020-09-25 DIAGNOSIS — Z01419 Encounter for gynecological examination (general) (routine) without abnormal findings: Secondary | ICD-10-CM | POA: Diagnosis not present

## 2020-09-25 DIAGNOSIS — Z6826 Body mass index (BMI) 26.0-26.9, adult: Secondary | ICD-10-CM | POA: Diagnosis not present

## 2020-09-25 DIAGNOSIS — Z1231 Encounter for screening mammogram for malignant neoplasm of breast: Secondary | ICD-10-CM | POA: Diagnosis not present

## 2020-09-25 DIAGNOSIS — Z124 Encounter for screening for malignant neoplasm of cervix: Secondary | ICD-10-CM | POA: Diagnosis not present

## 2020-10-04 ENCOUNTER — Ambulatory Visit (INDEPENDENT_AMBULATORY_CARE_PROVIDER_SITE_OTHER): Payer: BC Managed Care – PPO

## 2020-10-04 ENCOUNTER — Ambulatory Visit (INDEPENDENT_AMBULATORY_CARE_PROVIDER_SITE_OTHER): Payer: BC Managed Care – PPO | Admitting: Podiatry

## 2020-10-04 ENCOUNTER — Encounter: Payer: Self-pay | Admitting: Podiatry

## 2020-10-04 ENCOUNTER — Other Ambulatory Visit: Payer: Self-pay

## 2020-10-04 DIAGNOSIS — M2042 Other hammer toe(s) (acquired), left foot: Secondary | ICD-10-CM

## 2020-10-04 DIAGNOSIS — M79672 Pain in left foot: Secondary | ICD-10-CM

## 2020-10-04 NOTE — Progress Notes (Signed)
Subjective:   Patient ID: Christine Nolan, female   DOB: 55 y.o.   MRN: [id number]   HPI Patient presents stating doing much better with swelling having gone down quite a bit   ROS      Objective:  Physical Exam  Neurovascular status intact with moderate discomfort of the left forefoot continuing to resolve with patient just having been on her foot quite a bit over the weekend     Assessment:  Doing well after having foot surgery bilateral with some swelling of the left foot     Plan:  Reviewed condition and went ahead today and I took final x-rays I reviewed with her and discharge patient.  Patient may resume normal activity is encouraged to call with any questions concerns which may arise  X-rays indicate satisfactory removal of pin good alignment noted some healing still to go but there is fibrous union across the second and third digits clinically

## 2020-10-09 ENCOUNTER — Other Ambulatory Visit: Payer: Self-pay | Admitting: Podiatry

## 2020-10-09 DIAGNOSIS — M2042 Other hammer toe(s) (acquired), left foot: Secondary | ICD-10-CM

## 2020-12-26 DIAGNOSIS — M1811 Unilateral primary osteoarthritis of first carpometacarpal joint, right hand: Secondary | ICD-10-CM | POA: Diagnosis not present

## 2020-12-26 DIAGNOSIS — G5601 Carpal tunnel syndrome, right upper limb: Secondary | ICD-10-CM | POA: Diagnosis not present

## 2021-01-25 DIAGNOSIS — L821 Other seborrheic keratosis: Secondary | ICD-10-CM | POA: Diagnosis not present

## 2021-01-25 DIAGNOSIS — Z6826 Body mass index (BMI) 26.0-26.9, adult: Secondary | ICD-10-CM | POA: Diagnosis not present

## 2021-01-25 DIAGNOSIS — N950 Postmenopausal bleeding: Secondary | ICD-10-CM | POA: Diagnosis not present

## 2021-01-30 DIAGNOSIS — G5601 Carpal tunnel syndrome, right upper limb: Secondary | ICD-10-CM | POA: Diagnosis not present

## 2021-01-30 DIAGNOSIS — M1811 Unilateral primary osteoarthritis of first carpometacarpal joint, right hand: Secondary | ICD-10-CM | POA: Diagnosis not present

## 2021-03-06 DIAGNOSIS — G5603 Carpal tunnel syndrome, bilateral upper limbs: Secondary | ICD-10-CM | POA: Diagnosis not present

## 2021-03-06 DIAGNOSIS — M1812 Unilateral primary osteoarthritis of first carpometacarpal joint, left hand: Secondary | ICD-10-CM | POA: Diagnosis not present

## 2021-03-06 DIAGNOSIS — M180 Bilateral primary osteoarthritis of first carpometacarpal joints: Secondary | ICD-10-CM | POA: Diagnosis not present

## 2021-03-06 DIAGNOSIS — G5602 Carpal tunnel syndrome, left upper limb: Secondary | ICD-10-CM | POA: Diagnosis not present

## 2021-03-28 DIAGNOSIS — G5601 Carpal tunnel syndrome, right upper limb: Secondary | ICD-10-CM | POA: Diagnosis not present

## 2021-04-03 DIAGNOSIS — G5601 Carpal tunnel syndrome, right upper limb: Secondary | ICD-10-CM | POA: Diagnosis not present

## 2021-04-03 DIAGNOSIS — M180 Bilateral primary osteoarthritis of first carpometacarpal joints: Secondary | ICD-10-CM | POA: Diagnosis not present

## 2021-04-13 DIAGNOSIS — G5601 Carpal tunnel syndrome, right upper limb: Secondary | ICD-10-CM | POA: Diagnosis not present

## 2021-04-19 DIAGNOSIS — B079 Viral wart, unspecified: Secondary | ICD-10-CM | POA: Diagnosis not present

## 2021-04-19 DIAGNOSIS — D225 Melanocytic nevi of trunk: Secondary | ICD-10-CM | POA: Diagnosis not present

## 2021-04-19 DIAGNOSIS — L573 Poikiloderma of Civatte: Secondary | ICD-10-CM | POA: Diagnosis not present

## 2021-04-19 DIAGNOSIS — L821 Other seborrheic keratosis: Secondary | ICD-10-CM | POA: Diagnosis not present

## 2021-04-19 DIAGNOSIS — D485 Neoplasm of uncertain behavior of skin: Secondary | ICD-10-CM | POA: Diagnosis not present

## 2021-04-19 DIAGNOSIS — D1801 Hemangioma of skin and subcutaneous tissue: Secondary | ICD-10-CM | POA: Diagnosis not present

## 2021-07-05 DIAGNOSIS — G5602 Carpal tunnel syndrome, left upper limb: Secondary | ICD-10-CM | POA: Diagnosis not present

## 2021-07-27 DIAGNOSIS — S46811A Strain of other muscles, fascia and tendons at shoulder and upper arm level, right arm, initial encounter: Secondary | ICD-10-CM | POA: Diagnosis not present

## 2021-08-23 DIAGNOSIS — R319 Hematuria, unspecified: Secondary | ICD-10-CM | POA: Diagnosis not present

## 2021-08-23 DIAGNOSIS — I10 Essential (primary) hypertension: Secondary | ICD-10-CM | POA: Diagnosis not present

## 2021-08-23 DIAGNOSIS — E782 Mixed hyperlipidemia: Secondary | ICD-10-CM | POA: Diagnosis not present

## 2021-08-23 DIAGNOSIS — E041 Nontoxic single thyroid nodule: Secondary | ICD-10-CM | POA: Diagnosis not present

## 2021-08-23 DIAGNOSIS — Z0000 Encounter for general adult medical examination without abnormal findings: Secondary | ICD-10-CM | POA: Diagnosis not present

## 2021-08-26 NOTE — Progress Notes (Signed)
Cardiology Office Note   Date:  08/28/2021   ID:  Christine Nolan, DOB May 13, 1966, MRN ID:[id number]  PCP:  Mayra Neer, MD    No chief complaint on file.  Coronary artery calcification  Wt Readings from Last 3 Encounters:  08/28/21 178 lb (80.7 kg)  09/01/20 173 lb 6.4 oz (78.7 kg)  02/25/20 167 lb 12.8 oz (76.1 kg)       History of Present Illness: Christine Nolan is a 56 y.o. female   Who had a CT scan in 2018 which showed atherosclerosis in the aorta and coronary arteries, with aortic valve calcification.  She has family h/o CAD.  Her daughter recently had SCAD.  She requires injection for lipid lowering.   Father with AFib and leaky valve, aortic aneurysm- he is my patent.  Mother had CAD in her mid 17s with CABG.  She was overweight.    She had some GERD sx in 3/18 prompting a treadmill.  She was stressed at the time as well.    She had a stress test in 3/18: The patient walked for 6:30 of a standard Bruce protocol. She achieved a peak HR of 148 which is 88% predicted maximal HR . There was insignificant nonspecific ST depression in the lateral leads which resolved quickly in the recovery phase. These ST changes were likely due to to the hypertensive response . Blood pressure demonstrated a hypertensive response to exercise. Negative GXT   Repeat GXT in 2019 was negative.   She has been on Lexapro and Ativan for anxiety.  She has had a lot of stress at work.  SHe had a panic attack in Jan 2021.     In 2021, she has had bilateral foot surgery for hammer toes.  Had an infection in one of the sites.   Tolerating statin.    Denies : Chest pain. Dizziness. Leg edema. Nitroglycerin use. Orthopnea. Palpitations. Paroxysmal nocturnal dyspnea. Shortness of breath. Syncope.   Has thyroid nodule- followed with ultrasound.  Not getting 150 minutes/week of exercise.      Past Medical History:  Diagnosis Date   CAD (coronary artery disease)    Hematuria, unspecified     none recent   Hyperlipidemia    Hypertension    PMB (postmenopausal bleeding)    Pneumonia 08/1998   "bacterial"   Thyroid nodule    followed by dr Brigitte Pulse   UTI (urinary tract infection)    hx of    Past Surgical History:  Procedure Laterality Date   CERVICAL FUSION  11/2010   plating and cadaver bone   colonscopy  10/2019   1 benign polyp   CYST REMOVAL HAND Right before 2006   wrist   CYSTOSCOPY  yrs ago   HYSTEROSCOPY WITH D & C N/A 02/25/2020   Procedure: DILATATION AND CURETTAGE /HYSTEROSCOPY;  Surgeon: Jerelyn Charles, MD;  Location: [facility];  Service: Gynecology;  Laterality: N/A;   KNEE SURGERY Left 01/2000   knee cap  repair of patella   left foot surgery  age 65   TUBAL LIGATION  before 2006   WISDOM TOOTH EXTRACTION  2009     Current Outpatient Medications  Medication Sig Dispense Refill   acetaminophen (TYLENOL) 500 MG tablet Take 1,000 mg by mouth every 6 (six) hours as needed.     amoxicillin (AMOXIL) 875 MG tablet Take 1 tablet by mouth in the morning and at bedtime.     atorvastatin (LIPITOR) 40 MG tablet  Take 40 mg by mouth every evening.      Calcium Carbonate-Vitamin D3 600-400 MG-UNIT TABS Take 1 tablet by mouth every evening.      dimenhyDRINATE (DRAMAMINE) 50 MG tablet Take 50 mg by mouth every 8 (eight) hours as needed.     diphenhydrAMINE (BENADRYL) 25 MG tablet Take 25 mg by mouth every 6 (six) hours as needed.     estradiol (ESTRACE) 2 MG tablet Take 2 mg by mouth every evening.      LORazepam (ATIVAN) 0.5 MG tablet lorazepam 0.5 mg tablet  TAKE 1/2 TO 1 TABLET TWICE DAILY AS NEEDED FOR ANXIETY     medroxyPROGESTERone (PROVERA) 2.5 MG tablet Take 2.5 mg by mouth at bedtime.   3   metoprolol succinate (TOPROL-XL) 50 MG 24 hr tablet Take 50 mg by mouth every evening. Take with or immediately following a meal.     Multiple Vitamins-Minerals (MULTIVITAMIN ADULT PO) Take 1 tablet by mouth daily.      Omega-3 Fatty Acids (FISH OIL) 1200  MG CPDR Take by mouth daily.     sertraline (ZOLOFT) 50 MG tablet Take 50 mg by mouth every evening.      No current facility-administered medications for this visit.    Allergies:   Iodine, Shellfish allergy, and Tamiflu [oseltamivir phosphate]    Social History:  The patient  reports that she has never smoked. She has never used smokeless tobacco. She reports current alcohol use. She reports that she does not use drugs.   Family History:  The patient's family history includes Aortic aneurysm in her father, paternal grandfather, and paternal grandmother; Heart attack (age of onset: 23) in her daughter; Heart attack (age of onset: 46) in her mother; Heart disease in her father.    ROS:  Please see the history of present illness.   Otherwise, review of systems are positive for knee pain limits using a bike.   All other systems are reviewed and negative.    PHYSICAL EXAM: VS:  BP (!) 152/92    Pulse 80    Ht 5\' 8"  (1.727 m)    Wt 178 lb (80.7 kg)    LMP 07/01/2017    SpO2 98%    BMI 27.06 kg/m  , BMI Body mass index is 27.06 kg/m. GEN: Well nourished, well developed, in no acute distress HEENT: normal Neck: no JVD, carotid bruits, or masses Cardiac: RRR; no murmurs, rubs, or gallops,no edema  Respiratory:  clear to auscultation bilaterally, normal work of breathing GI: soft, nontender, nondistended, + BS MS: no deformity or atrophy Skin: warm and dry, no rash Neuro:  Strength and sensation are intact Psych: euthymic mood, full affect   EKG:   The ekg ordered today demonstrates normal ECG   Recent Labs: No results found for requested labs within last 8760 hours.   Lipid Panel No results found for: CHOL, TRIG, HDL, CHOLHDL, VLDL, LDLCALC, LDLDIRECT   Other studies Reviewed: Additional studies/ records that were reviewed today with results demonstrating: LDL 96.   ASSESSMENT AND PLAN:  Coronary artery calcification: Continue statin. No angina.  Increase exercise.   Hyperlipidemia: LDL 96.  Whole food plant-based is recommended.  High-fiber intake is recommended.  Avoiding processed foods. HTN: High reading today, we may need to adjust the medications if these readings are confirmed at home.  The blood pressure may improve with more exercise, getting 150 minutes a week of walking.  Would give 3 months for lifestyle changes to see if the  blood pressure will come down without any additional medicine.  I would recommend an ACE inhibitor if blood pressure medicine was needed because that has additional benefits of lowering the risk of heart attack and stroke. Aortic atherosclerosis: Continue atorvastatin.  No AAA in 2018.  Symptoms blood pressure readings from home.  We will decide if any additional blood pressure medication is needed.  If you feel are starting to increase the exercise, but stamina is not improving, let us know.  That could be a sign of progressive CAD   Current medicines are reviewed at length with the patient today.  The patient concerns regarding her medicines were addressed.  The following changes have been made:  No change  Labs/ tests ordered today include:  No orders of the defined types were placed in this encounter.   Recommend 150 minutes/week of aerobic exercise Low fat, low carb, high fiber diet recommended  Disposition:   FU in 1 year   Signed, Larae Grooms, MD  08/28/2021 3:35 PM    [facility] [address], [place], [place]  [address] Phone: [phone]; Fax: [phone]

## 2021-08-28 ENCOUNTER — Other Ambulatory Visit: Payer: Self-pay

## 2021-08-28 ENCOUNTER — Ambulatory Visit: Payer: BC Managed Care – PPO | Admitting: Interventional Cardiology

## 2021-08-28 ENCOUNTER — Encounter: Payer: Self-pay | Admitting: Interventional Cardiology

## 2021-08-28 VITALS — BP 159/76 | HR 80 | Ht 68.0 in | Wt 178.0 lb

## 2021-08-28 DIAGNOSIS — I700 Atherosclerosis of aorta: Secondary | ICD-10-CM | POA: Diagnosis not present

## 2021-08-28 DIAGNOSIS — I2510 Atherosclerotic heart disease of native coronary artery without angina pectoris: Secondary | ICD-10-CM | POA: Diagnosis not present

## 2021-08-28 DIAGNOSIS — E782 Mixed hyperlipidemia: Secondary | ICD-10-CM | POA: Diagnosis not present

## 2021-08-28 DIAGNOSIS — I10 Essential (primary) hypertension: Secondary | ICD-10-CM

## 2021-08-28 NOTE — Patient Instructions (Signed)
Medication Instructions:  Your physician recommends that you continue on your current medications as directed. Please refer to the Current Medication list given to you today.  *If you need a refill on your cardiac medications before your next appointment, please call your pharmacy*   Lab Work: none If you have labs (blood work) drawn today and your tests are completely normal, you will receive your results only by: MyChart Message (if you have MyChart) OR A paper copy in the mail If you have any lab test that is abnormal or we need to change your treatment, we will call you to review the results.   Testing/Procedures: none   Follow-Up: At [facility], you and your health needs are our priority.  As part of our continuing mission to provide you with exceptional heart care, we have created designated Provider Care Teams.  These Care Teams include your primary Cardiologist (physician) and Advanced Practice Providers (APPs -  Physician Assistants and Nurse Practitioners) who all work together to provide you with the care you need, when you need it.  We recommend signing up for the patient portal called "MyChart".  Sign up information is provided on this After Visit Summary.  MyChart is used to connect with patients for Virtual Visits (Telemedicine).  Patients are able to view lab/test results, encounter notes, upcoming appointments, etc.  Non-urgent messages can be sent to your provider as well.   To learn more about what you can do with MyChart, go to [email or web address].    Your next appointment:   12 month(s)  The format for your next appointment:   In Person  Provider:   Lance Muss, MD     Other Instructions Check blood pressure at home and keep record of readings. Send readings in through my chart  How to Take Your Blood Pressure Blood pressure measures how strongly your blood is pressing against the walls of your arteries. Arteries are blood vessels that carry  blood from your heart throughout your body. You can take your blood pressure at home with a machine. You may need to check your blood pressure at home: To check if you have high blood pressure (hypertension). To check your blood pressure over time. To make sure your blood pressure medicine is working. Supplies needed: Blood pressure machine, or monitor. Dining room chair to sit in. Table or desk. Small notebook. Pencil or pen. How to prepare Avoid these things for 30 minutes before checking your blood pressure: Having drinks with caffeine in them, such as coffee or tea. Drinking alcohol. Eating. Smoking. Exercising. Do these things five minutes before checking your blood pressure: Go to the bathroom and pee (urinate). Sit in a dining chair. Do not sit on a soft couch or an armchair. Be quiet. Do not talk. How to take your blood pressure Follow the instructions that came with your machine. If you have a digital blood pressure monitor, these may be the instructions: Sit up straight. Place your feet on the floor. Do not cross your ankles or legs. Rest your left arm at the level of your heart. You may rest it on a table, desk, or chair. Pull up your shirt sleeve. Wrap the blood pressure cuff around the upper part of your left arm. The cuff should be 1 inch (2.5 cm) above your elbow. It is best to wrap the cuff around bare skin. Fit the cuff snugly around your arm. You should be able to place only one finger between the cuff and your arm.  Place the cord so that it rests in the bend of your elbow. Press the power button. Sit quietly while the cuff fills with air and loses air. Write down the numbers on the screen. Wait 2-3 minutes and then repeat steps 1-10. What do the numbers mean? Two numbers make up your blood pressure. The first number is called systolic pressure. The second is called diastolic pressure. An example of a blood pressure reading is "120 over 80" (or 120/80). If you  are an adult and do not have a medical condition, use this guide to find out if your blood pressure is normal: Normal First number: below 120. Second number: below 80. Elevated First number: 120-129. Second number: below 80. Hypertension stage 1 First number: 130-139. Second number: 80-89. Hypertension stage 2 First number: 140 or above. Second number: 90 or above. Your blood pressure is above normal even if only the first or only the second number is above normal. Follow these instructions at home: Medicines Take over-the-counter and prescription medicines only as told by your doctor. Tell your doctor if your medicine is causing side effects. General instructions Check your blood pressure as often as your doctor tells you to. Check your blood pressure at the same time every day. Take your monitor to your next doctor's appointment. Your doctor will: Make sure you are using it correctly. Make sure it is working right. Understand what your blood pressure numbers should be. Keep all follow-up visits as told by your doctor. This is important. General tips You will need a blood pressure machine, or monitor. Your doctor can suggest a monitor. You can buy one at a drugstore or online. When choosing one: Choose one with an arm cuff. Choose one that wraps around your upper arm. Only one finger should fit between your arm and the cuff. Do not choose one that measures your blood pressure from your wrist or finger. Where to find more information American Heart Association: www.heart.org Contact a doctor if: Your blood pressure keeps being high. Your blood pressure is suddenly low. Get help right away if: Your first blood pressure number is higher than 180. Your second blood pressure number is higher than 120. Summary Check your blood pressure at the same time every day. Avoid caffeine, alcohol, smoking, and exercise for 30 minutes before checking your blood pressure. Make sure you  understand what your blood pressure numbers should be. This information is not intended to replace advice given to you by your health care provider. Make sure you discuss any questions you have with your health care provider. Document Revised: 04/26/2020 Document Reviewed: 06/11/2019 Elsevier Patient Education  2022 [employer].

## 2021-08-31 DIAGNOSIS — E041 Nontoxic single thyroid nodule: Secondary | ICD-10-CM | POA: Diagnosis not present

## 2021-08-31 DIAGNOSIS — E042 Nontoxic multinodular goiter: Secondary | ICD-10-CM | POA: Diagnosis not present

## 2021-10-01 DIAGNOSIS — Z1231 Encounter for screening mammogram for malignant neoplasm of breast: Secondary | ICD-10-CM | POA: Diagnosis not present

## 2021-10-01 DIAGNOSIS — Z6827 Body mass index (BMI) 27.0-27.9, adult: Secondary | ICD-10-CM | POA: Diagnosis not present

## 2021-10-01 DIAGNOSIS — Z01419 Encounter for gynecological examination (general) (routine) without abnormal findings: Secondary | ICD-10-CM | POA: Diagnosis not present

## 2021-11-12 DIAGNOSIS — H10021 Other mucopurulent conjunctivitis, right eye: Secondary | ICD-10-CM | POA: Diagnosis not present

## 2021-11-12 DIAGNOSIS — H109 Unspecified conjunctivitis: Secondary | ICD-10-CM | POA: Diagnosis not present

## 2021-11-12 DIAGNOSIS — Z03818 Encounter for observation for suspected exposure to other biological agents ruled out: Secondary | ICD-10-CM | POA: Diagnosis not present

## 2021-12-16 ENCOUNTER — Encounter: Payer: Self-pay | Admitting: Interventional Cardiology

## 2022-01-24 ENCOUNTER — Ambulatory Visit: Payer: BC Managed Care – PPO | Admitting: Podiatry

## 2022-01-25 ENCOUNTER — Ambulatory Visit: Payer: BC Managed Care – PPO | Admitting: Podiatry

## 2022-01-25 ENCOUNTER — Ambulatory Visit (INDEPENDENT_AMBULATORY_CARE_PROVIDER_SITE_OTHER): Payer: BC Managed Care – PPO

## 2022-01-25 DIAGNOSIS — M79671 Pain in right foot: Secondary | ICD-10-CM

## 2022-01-25 DIAGNOSIS — M6740 Ganglion, unspecified site: Secondary | ICD-10-CM

## 2022-01-25 NOTE — Progress Notes (Signed)
Subjective:   Patient ID: Christine Nolan, female   DOB: 56 y.o.   MRN: [id number]   HPI Patient states that she has developed pain in her right forefoot and also has developed swelling of her right ankle that is been occurring for around the last month.  States that she is not sure what is causing these different elements but the area on the right foot is getting sore   ROS      Objective:  Physical Exam  Neurovascular status intact with a new mass on the dorsal lateral aspect of the right foot which measures about 1.5 x 1.5 cm and appears to be within subcutaneous tissue with minimal swelling now the picture that she showed me indicated severe swelling in the right ankle with also some pain of a moderate nature in the right calf muscle localized     Assessment:  Ganglionic cyst formation right with the possibility of a low nature of any kind of clot right lower leg even though the symptoms are minimal and possibility that there may be a metabolic imbalance or dehydration      Plan:  H&P x-rays reviewed and I went ahead today I did a anesthesia block of the right forefoot I then aspirated the area getting out of gelatinous material applied a small amount of steroid to reduce swelling and shrink it and applied compression.  I am also ordering a Doppler and send that request out to try to get that done in the next few days and I gave her strict instructions if she should get any shortness of breath or increased swelling in her right lower leg that she needs to go to the emergency room.  I am also ordering blood work to rule out any metabolic imbalance  X-rays indicate no indications of stress fracture screw in place second metatarsal good alignment noted

## 2022-01-26 LAB — COMPREHENSIVE METABOLIC PANEL
AG Ratio: 1.2 (calc) (ref 1.0–2.5)
ALT: 21 U/L (ref 6–29)
AST: 19 U/L (ref 10–35)
Albumin: 4.6 g/dL (ref 3.6–5.1)
Alkaline phosphatase (APISO): 67 U/L (ref 37–153)
BUN: 20 mg/dL (ref 7–25)
CO2: 25 mmol/L (ref 20–32)
Calcium: 10.1 mg/dL (ref 8.6–10.4)
Chloride: 102 mmol/L (ref 98–110)
Creat: 1.01 mg/dL (ref 0.50–1.03)
Globulin: 3.7 g/dL (calc) (ref 1.9–3.7)
Glucose, Bld: 90 mg/dL (ref 65–99)
Potassium: 4.3 mmol/L (ref 3.5–5.3)
Sodium: 138 mmol/L (ref 135–146)
Total Bilirubin: 0.4 mg/dL (ref 0.2–1.2)
Total Protein: 8.3 g/dL — ABNORMAL HIGH (ref 6.1–8.1)

## 2022-01-28 ENCOUNTER — Other Ambulatory Visit: Payer: Self-pay | Admitting: Podiatry

## 2022-01-28 ENCOUNTER — Telehealth: Payer: Self-pay | Admitting: *Deleted

## 2022-01-28 ENCOUNTER — Ambulatory Visit (HOSPITAL_COMMUNITY)
Admission: RE | Admit: 2022-01-28 | Discharge: 2022-01-28 | Disposition: A | Discharge status: Home / Self Care | Payer: BC Managed Care – PPO | Source: Ambulatory Visit | Attending: Podiatry | Admitting: Podiatry

## 2022-01-28 DIAGNOSIS — M79671 Pain in right foot: Secondary | ICD-10-CM | POA: Diagnosis not present

## 2022-01-28 DIAGNOSIS — M6740 Ganglion, unspecified site: Secondary | ICD-10-CM

## 2022-01-28 NOTE — Telephone Encounter (Signed)
Megan w/ V&V calling with results from vascular study, she is negative for DVT. Notes will be ready to view in epic.

## 2022-01-31 DIAGNOSIS — J018 Other acute sinusitis: Secondary | ICD-10-CM | POA: Diagnosis not present

## 2022-02-20 DIAGNOSIS — M79671 Pain in right foot: Secondary | ICD-10-CM | POA: Diagnosis not present

## 2022-02-20 DIAGNOSIS — M6740 Ganglion, unspecified site: Secondary | ICD-10-CM | POA: Diagnosis not present

## 2022-02-25 DIAGNOSIS — M79671 Pain in right foot: Secondary | ICD-10-CM | POA: Diagnosis not present

## 2022-02-25 DIAGNOSIS — M25571 Pain in right ankle and joints of right foot: Secondary | ICD-10-CM | POA: Diagnosis not present

## 2022-03-07 DIAGNOSIS — M25571 Pain in right ankle and joints of right foot: Secondary | ICD-10-CM | POA: Diagnosis not present

## 2022-03-07 DIAGNOSIS — M79671 Pain in right foot: Secondary | ICD-10-CM | POA: Diagnosis not present

## 2022-03-11 DIAGNOSIS — M25571 Pain in right ankle and joints of right foot: Secondary | ICD-10-CM | POA: Diagnosis not present

## 2022-08-27 DIAGNOSIS — E782 Mixed hyperlipidemia: Secondary | ICD-10-CM | POA: Diagnosis not present

## 2022-08-27 DIAGNOSIS — Z0000 Encounter for general adult medical examination without abnormal findings: Secondary | ICD-10-CM | POA: Diagnosis not present

## 2022-08-27 DIAGNOSIS — I10 Essential (primary) hypertension: Secondary | ICD-10-CM | POA: Diagnosis not present

## 2022-08-27 DIAGNOSIS — E041 Nontoxic single thyroid nodule: Secondary | ICD-10-CM | POA: Diagnosis not present

## 2022-09-13 ENCOUNTER — Telehealth: Payer: Self-pay | Admitting: *Deleted

## 2022-09-13 NOTE — Telephone Encounter (Signed)
Patient notified.  She reports Dr Brigitte Pulse changed her to Rosuvastatin based on these results.  Patient is not sure if dose is 20 mg or 40 mg. She will continue Rosuvastatin

## 2022-09-13 NOTE — Telephone Encounter (Signed)
-----   Message from Jettie Booze, MD sent at 09/10/2022  5:45 PM EDT ----- Increase atorvastatin to 80 mg daily.  Liver and lipids in 3 months.  LDL has increased > 100.

## 2022-10-10 DIAGNOSIS — Z6826 Body mass index (BMI) 26.0-26.9, adult: Secondary | ICD-10-CM | POA: Diagnosis not present

## 2022-10-10 DIAGNOSIS — Z01419 Encounter for gynecological examination (general) (routine) without abnormal findings: Secondary | ICD-10-CM | POA: Diagnosis not present

## 2022-10-10 DIAGNOSIS — Z1231 Encounter for screening mammogram for malignant neoplasm of breast: Secondary | ICD-10-CM | POA: Diagnosis not present

## 2022-10-13 NOTE — Progress Notes (Unsigned)
Cardiology Office Note   Date:  10/13/2022   ID:  Christine Nolan, DOB 1966/03/24, MRN [id number]  PCP:  Lupita Raider, MD    No chief complaint on file.  Coronary artery calcification  Wt Readings from Last 3 Encounters:  08/28/21 178 lb (80.7 kg)  09/01/20 173 lb 6.4 oz (78.7 kg)  02/25/20 167 lb 12.8 oz (76.1 kg)       History of Present Illness: Christine Nolan is a 57 y.o. female  Who had a CT scan in 2018 which showed atherosclerosis in the aorta and coronary arteries, with aortic valve calcification.  She has family h/o CAD.  Her daughter recently had SCAD.  She requires injection for lipid lowering.   Father with AFib and leaky valve, aortic aneurysm- he is my patent.  Mother had CAD in her mid 70s with CABG.  She was overweight.    She had some GERD sx in 3/18 prompting a treadmill.  She was stressed at the time as well.    She had a stress test in 3/18: The patient walked for 6:30 of a standard Bruce protocol. She achieved a peak HR of 148 which is 88% predicted maximal HR . There was insignificant nonspecific ST depression in the lateral leads which resolved quickly in the recovery phase. These ST changes were likely due to to the hypertensive response . Blood pressure demonstrated a hypertensive response to exercise. Negative GXT   Repeat GXT in 2019 was negative.   She has been on Lexapro and Ativan for anxiety.  She has had a lot of stress at work.  SHe had a panic attack in Jan 2021.     In 2021, she has had bilateral foot surgery for hammer toes.  Had an infection in one of the sites.    In 2023, she noted difficulty reaching the exercise target noted below.    Past Medical History:  Diagnosis Date   CAD (coronary artery disease)    Hematuria, unspecified    none recent   Hyperlipidemia    Hypertension    PMB (postmenopausal bleeding)    Pneumonia 08/1998   "bacterial"   Thyroid nodule    followed by dr Clelia Croft   UTI (urinary tract infection)     hx of    Past Surgical History:  Procedure Laterality Date   CERVICAL FUSION  11/2010   plating and cadaver bone   colonscopy  10/2019   1 benign polyp   CYST REMOVAL HAND Right before 2006   wrist   CYSTOSCOPY  yrs ago   HYSTEROSCOPY WITH D & C N/A 02/25/2020   Procedure: DILATATION AND CURETTAGE /HYSTEROSCOPY;  Surgeon: Marlow Baars, MD;  Location: [facility];  Service: Gynecology;  Laterality: N/A;   KNEE SURGERY Left 01/2000   knee cap  repair of patella   left foot surgery  age 56   TUBAL LIGATION  before 2006   WISDOM TOOTH EXTRACTION  2009     Current Outpatient Medications  Medication Sig Dispense Refill   acetaminophen (TYLENOL) 500 MG tablet Take 1,000 mg by mouth every 6 (six) hours as needed.     atorvastatin (LIPITOR) 40 MG tablet Take 40 mg by mouth every evening.      Calcium Carbonate-Vitamin D3 600-400 MG-UNIT TABS Take 1 tablet by mouth every evening.      dimenhyDRINATE (DRAMAMINE) 50 MG tablet Take 50 mg by mouth every 8 (eight) hours as needed.  diphenhydrAMINE (BENADRYL) 25 MG tablet Take 25 mg by mouth every 6 (six) hours as needed.     estradiol (ESTRACE) 2 MG tablet Take 2 mg by mouth every evening.      LORazepam (ATIVAN) 0.5 MG tablet lorazepam 0.5 mg tablet  TAKE 1/2 TO 1 TABLET TWICE DAILY AS NEEDED FOR ANXIETY     medroxyPROGESTERone (PROVERA) 2.5 MG tablet Take 2.5 mg by mouth at bedtime.   3   metoprolol succinate (TOPROL-XL) 50 MG 24 hr tablet Take 50 mg by mouth every evening. Take with or immediately following a meal.     Multiple Vitamins-Minerals (MULTIVITAMIN ADULT PO) Take 1 tablet by mouth daily.      Omega-3 Fatty Acids (FISH OIL) 1200 MG CPDR Take by mouth daily.     polyethylene glycol-electrolytes (GAVILYTE-N WITH FLAVOR PACK) 420 g solution See admin instructions.     sertraline (ZOLOFT) 50 MG tablet Take 50 mg by mouth every evening.      No current facility-administered medications for this visit.     Allergies:   Iodine, Shellfish allergy, and Tamiflu [oseltamivir phosphate]    Social History:  The patient  reports that she has never smoked. She has never used smokeless tobacco. She reports current alcohol use. She reports that she does not use drugs.   Family History:  The patient's ***family history includes Aortic aneurysm in her father, paternal grandfather, and paternal grandmother; Heart attack (age of onset: 70) in her daughter; Heart attack (age of onset: 65) in her mother; Heart disease in her father.    ROS:  Please see the history of present illness.   Otherwise, review of systems are positive for ***.   All other systems are reviewed and negative.    PHYSICAL EXAM: VS:  LMP 07/01/2017  , BMI There is no height or weight on file to calculate BMI. GEN: Well nourished, well developed, in no acute distress HEENT: normal Neck: no JVD, carotid bruits, or masses Cardiac: ***RRR; no murmurs, rubs, or gallops,no edema  Respiratory:  clear to auscultation bilaterally, normal work of breathing GI: soft, nontender, nondistended, + BS MS: no deformity or atrophy Skin: warm and dry, no rash Neuro:  Strength and sensation are intact Psych: euthymic mood, full affect   EKG:   The ekg ordered today demonstrates ***   Recent Labs: 01/25/2022: ALT 21; BUN 20; Creat 1.01; Potassium 4.3; Sodium 138   Lipid Panel No results found for: "CHOL", "TRIG", "HDL", "CHOLHDL", "VLDL", "LDLCALC", "LDLDIRECT"   Other studies Reviewed: Additional studies/ records that were reviewed today with results demonstrating: ***.   ASSESSMENT AND PLAN:  Coronary artery calcification: Watch for any decrease in exercise tolerance. Hyperlipidemia: Whole food, plant-based diet.  High-fiber diet.  Avoid processed foods. Hypertension: Monitor blood pressure readings at home. Aortic atherosclerosis: Continue atorvastatin.  No AAA in 2018.   Current medicines are reviewed at length with the patient  today.  The patient concerns regarding her medicines were addressed.  The following changes have been made:  No change***  Labs/ tests ordered today include: *** No orders of the defined types were placed in this encounter.   Recommend 150 minutes/week of aerobic exercise Low fat, low carb, high fiber diet recommended  Disposition:   FU in ***   Signed, Lance Muss, MD  10/13/2022 8:06 PM    [facility] [address] [place], [place], [place]  [address] Phone: [phone]; Fax: [phone]

## 2022-10-14 ENCOUNTER — Ambulatory Visit: Payer: BC Managed Care – PPO | Attending: Interventional Cardiology | Admitting: Interventional Cardiology

## 2022-10-14 ENCOUNTER — Encounter: Payer: Self-pay | Admitting: Interventional Cardiology

## 2022-10-14 VITALS — BP 152/88 | HR 83 | Ht 68.5 in | Wt 173.6 lb

## 2022-10-14 DIAGNOSIS — I700 Atherosclerosis of aorta: Secondary | ICD-10-CM | POA: Diagnosis not present

## 2022-10-14 DIAGNOSIS — I10 Essential (primary) hypertension: Secondary | ICD-10-CM | POA: Diagnosis not present

## 2022-10-14 DIAGNOSIS — I2510 Atherosclerotic heart disease of native coronary artery without angina pectoris: Secondary | ICD-10-CM

## 2022-10-14 DIAGNOSIS — E782 Mixed hyperlipidemia: Secondary | ICD-10-CM | POA: Diagnosis not present

## 2022-10-14 DIAGNOSIS — R072 Precordial pain: Secondary | ICD-10-CM

## 2022-10-14 LAB — BASIC METABOLIC PANEL
BUN/Creatinine Ratio: 21 (ref 9–23)
BUN: 19 mg/dL (ref 6–24)
CO2: 23 mmol/L (ref 20–29)
Calcium: 10.2 mg/dL (ref 8.7–10.2)
Chloride: 100 mmol/L (ref 96–106)
Creatinine, Ser: 0.89 mg/dL (ref 0.57–1.00)
Glucose: 107 mg/dL — ABNORMAL HIGH (ref 70–99)
Potassium: 4.6 mmol/L (ref 3.5–5.2)
Sodium: 135 mmol/L (ref 134–144)
eGFR: 75 mL/min/{1.73_m2} (ref >59)

## 2022-10-14 MED ORDER — PREDNISONE 50 MG PO TABS: ORAL_TABLET | ORAL | 0 refills | Status: DC

## 2022-10-14 NOTE — Patient Instructions (Addendum)
Medication Instructions:  Your physician recommends that you continue on your current medications as directed. Please refer to the Current Medication list given to you today.  *If you need a refill on your cardiac medications before your next appointment, please call your pharmacy*   Lab Work: Lab work to be done today--BMP.  Your physician recommends that you return for lab work iat the end of May--Lipid and liver profiles.  This will be fasting  If you have labs (blood work) drawn today and your tests are completely normal, you will receive your results only by: MyChart Message (if you have MyChart) OR A paper copy in the mail If you have any lab test that is abnormal or we need to change your treatment, we will call you to review the results.   Testing/Procedures: Your physician has requested that you have cardiac CT. Cardiac computed tomography (CT) is a painless test that uses an x-ray machine to take clear, detailed pictures of your heart. For further information please visit [email or web address] Please follow instruction sheet as given.  Your physician has requested that you have an abdominal aorta duplex. During this test, an ultrasound is used to evaluate the aorta. Allow 30 minutes for this exam. Do not eat after midnight the day before and avoid carbonated beverages  Dr Eldridge Dace recommends you have a vascuscreen  --Carotid doppler   Follow-Up: At [facility], you and your health needs are our priority.  As part of our continuing mission to provide you with exceptional heart care, we have created designated Provider Care Teams.  These Care Teams include your primary Cardiologist (physician) and Advanced Practice Providers (APPs -  Physician Assistants and Nurse Practitioners) who all work together to provide you with the care you need, when you need it.  We recommend signing up for the patient portal called "MyChart".  Sign up information is provided on this After  Visit Summary.  MyChart is used to connect with patients for Virtual Visits (Telemedicine).  Patients are able to view lab/test results, encounter notes, upcoming appointments, etc.  Non-urgent messages can be sent to your provider as well.   To learn more about what you can do with MyChart, go to [email or web address].    Your next appointment:   12 month(s)  Provider:   Lance Muss, MD     Other Instructions   Your cardiac CT will be scheduled at one of the below locations:   [facility] [address] [place], [place] [address] [phone]  OR  [facility] [address] [place], [place] [address] [phone]  OR   [facility] [address] [place], [place] [address] [phone]  If scheduled at [facility], please arrive at the [facility] and Children's Entrance (Entrance C2) of [facility] 30 minutes prior to test start time. You can use the FREE valet parking offered at entrance C (encouraged to control the heart rate for the test)  Proceed to the [facility] Radiology Department (first floor) to check-in and test prep.  All radiology patients and guests should use entrance C2 at [facility], accessed from [facility], even though the hospital's physical address listed is [address].    If scheduled at [facility] or [facility], please arrive 15 mins early for check-in and test prep.   Please follow these instructions carefully (unless otherwise directed):  Hold all erectile dysfunction  medications at least 3 days (72 hrs) prior to test. (Ie viagra, cialis, sildenafil, tadalafil, etc) We will administer nitroglycerin during this exam.   On the Night Before the Test: Be sure to Drink plenty of water. Do not consume any caffeinated/decaffeinated beverages or chocolate 12 hours  prior to your test. Do not take any antihistamines 12 hours prior to your test. If the patient has contrast allergy: Patient will need a prescription for Prednisone and very clear instructions (as follows): Prednisone 50 mg - take 13 hours prior to test Take another Prednisone 50 mg 7 hours prior to test Take another Prednisone 50 mg 1 hour prior to test Take Benadryl 50 mg 1 hour prior to test Patient must complete all four doses of above prophylactic medications. Patient will need a ride after test due to Benadryl.  On the Day of the Test: Drink plenty of water until 1 hour prior to the test. Do not eat any food 1 hour prior to test. You may take your regular medications prior to the test.  Take metoprolol two hours prior to test.--Take your Metoprolol Succinate 50 mg two hours before test If you take Furosemide/Hydrochlorothiazide/Spironolactone, please HOLD on the morning of the test. FEMALES- please wear underwire-free bra if available, avoid dresses & tight clothing       After the Test: Drink plenty of water. After receiving IV contrast, you may experience a mild flushed feeling. This is normal. On occasion, you may experience a mild rash up to 24 hours after the test. This is not dangerous. If this occurs, you can take Benadryl 25 mg and increase your fluid intake. If you experience trouble breathing, this can be serious. If it is severe call 911 IMMEDIATELY. If it is mild, please call our office. If you take any of these medications: Glipizide/Metformin, Avandament, Glucavance, please do not take 48 hours after completing test unless otherwise instructed.  We will call to schedule your test 2-4 weeks out understanding that some insurance companies will need an authorization prior to the service being performed.   For non-scheduling related questions, please contact the cardiac imaging nurse navigator should you have any questions/concerns: Rockwell Alexandria, Cardiac Imaging Nurse  Navigator Larey Brick, Cardiac Imaging Nurse Navigator [facility] Direct Office Dial: [phone]   For scheduling needs, including cancellations and rescheduling, please call [place], [phone].

## 2022-10-18 ENCOUNTER — Telehealth (HOSPITAL_COMMUNITY): Payer: Self-pay | Admitting: *Deleted

## 2022-10-18 NOTE — Telephone Encounter (Signed)
Reaching out to patient to offer assistance regarding upcoming cardiac imaging study; pt verbalizes understanding of appt date/time, parking situation and where to check in, pre-test NPO status and medications ordered, and verified current allergies; name and call back number provided for further questions should they arise  Larey Brick RN Navigator Cardiac Imaging [facility] [phone] office [phone] cell  Patient reports never having a CT scan with IV dye.  She reports that the MD prescribed the 13 hour prep for safety reason. Advised that she may not be allergic to the contrast and could not take the doses but she felt more comfortable taking the prep for the scan.  She verbalized understanding that she will need to take medications at 12am, 6am, 11am, and 12pm. She will arrive at 12:30pm.

## 2022-10-21 ENCOUNTER — Ambulatory Visit (HOSPITAL_BASED_OUTPATIENT_CLINIC_OR_DEPARTMENT_OTHER)
Admission: RE | Admit: 2022-10-21 | Discharge: 2022-10-21 | Disposition: A | Discharge status: Home / Self Care | Payer: BC Managed Care – PPO | Source: Ambulatory Visit | Attending: Cardiovascular Disease | Admitting: Cardiovascular Disease

## 2022-10-21 ENCOUNTER — Ambulatory Visit (HOSPITAL_COMMUNITY)
Admission: RE | Admit: 2022-10-21 | Discharge: 2022-10-21 | Disposition: A | Discharge status: Home / Self Care | Payer: BC Managed Care – PPO | Source: Ambulatory Visit | Attending: Interventional Cardiology | Admitting: Interventional Cardiology

## 2022-10-21 ENCOUNTER — Other Ambulatory Visit: Payer: Self-pay | Admitting: Cardiovascular Disease

## 2022-10-21 ENCOUNTER — Other Ambulatory Visit (HOSPITAL_COMMUNITY): Payer: Self-pay | Admitting: *Deleted

## 2022-10-21 DIAGNOSIS — R072 Precordial pain: Secondary | ICD-10-CM | POA: Insufficient documentation

## 2022-10-21 DIAGNOSIS — R931 Abnormal findings on diagnostic imaging of heart and coronary circulation: Secondary | ICD-10-CM

## 2022-10-21 DIAGNOSIS — I2510 Atherosclerotic heart disease of native coronary artery without angina pectoris: Secondary | ICD-10-CM | POA: Diagnosis not present

## 2022-10-21 MED ORDER — NITROGLYCERIN 0.4 MG SL SUBL
0.8000 mg | SUBLINGUAL_TABLET | Freq: Once | SUBLINGUAL | Status: AC
  Administered 2022-10-21: 0.8 mg via SUBLINGUAL

## 2022-10-21 MED ORDER — NITROGLYCERIN 0.4 MG SL SUBL
SUBLINGUAL_TABLET | SUBLINGUAL | Status: AC
  Filled 2022-10-21: qty 2

## 2022-10-21 MED ORDER — IOHEXOL 350 MG/ML SOLN
100.0000 mL | Freq: Once | INTRAVENOUS | Status: AC | PRN
  Administered 2022-10-21: 100 mL via INTRAVENOUS

## 2022-11-05 ENCOUNTER — Ambulatory Visit (HOSPITAL_COMMUNITY)
Admission: RE | Admit: 2022-11-05 | Discharge: 2022-11-05 | Disposition: A | Discharge status: Home / Self Care | Payer: BC Managed Care – PPO | Source: Ambulatory Visit | Attending: Interventional Cardiology | Admitting: Interventional Cardiology

## 2022-11-05 DIAGNOSIS — I700 Atherosclerosis of aorta: Secondary | ICD-10-CM | POA: Insufficient documentation

## 2022-11-05 DIAGNOSIS — E782 Mixed hyperlipidemia: Secondary | ICD-10-CM

## 2022-11-05 DIAGNOSIS — I2510 Atherosclerotic heart disease of native coronary artery without angina pectoris: Secondary | ICD-10-CM

## 2022-11-05 DIAGNOSIS — I10 Essential (primary) hypertension: Secondary | ICD-10-CM | POA: Diagnosis not present

## 2022-11-06 ENCOUNTER — Encounter (HOSPITAL_COMMUNITY): Payer: Self-pay | Admitting: Cardiology

## 2022-11-14 ENCOUNTER — Encounter (HOSPITAL_COMMUNITY): Payer: Self-pay | Admitting: Cardiology

## 2022-11-28 ENCOUNTER — Ambulatory Visit: Payer: BC Managed Care – PPO | Attending: Interventional Cardiology

## 2022-11-28 DIAGNOSIS — E782 Mixed hyperlipidemia: Secondary | ICD-10-CM | POA: Diagnosis not present

## 2022-11-28 DIAGNOSIS — I2510 Atherosclerotic heart disease of native coronary artery without angina pectoris: Secondary | ICD-10-CM | POA: Diagnosis not present

## 2022-11-28 LAB — LIPID PANEL
Chol/HDL Ratio: 3.1 ratio (ref 0.0–4.4)
Cholesterol, Total: 161 mg/dL (ref 100–199)
HDL: 52 mg/dL (ref >39)
LDL Chol Calc (NIH): 87 mg/dL (ref 0–99)
Triglycerides: 125 mg/dL (ref 0–149)
VLDL Cholesterol Cal: 22 mg/dL (ref 5–40)

## 2022-11-28 LAB — HEPATIC FUNCTION PANEL
ALT: 19 IU/L (ref 0–32)
AST: 18 IU/L (ref 0–40)
Albumin: 4.5 g/dL (ref 3.8–4.9)
Alkaline Phosphatase: 70 IU/L (ref 44–121)
Bilirubin Total: 0.3 mg/dL (ref 0.0–1.2)
Bilirubin, Direct: <0.1 mg/dL (ref 0.00–0.40)
Total Protein: 7.5 g/dL (ref 6.0–8.5)

## 2023-02-15 DIAGNOSIS — S61212A Laceration without foreign body of right middle finger without damage to nail, initial encounter: Secondary | ICD-10-CM | POA: Diagnosis not present

## 2023-02-15 DIAGNOSIS — Z23 Encounter for immunization: Secondary | ICD-10-CM | POA: Diagnosis not present

## 2023-04-24 DIAGNOSIS — J019 Acute sinusitis, unspecified: Secondary | ICD-10-CM | POA: Diagnosis not present

## 2023-05-07 DIAGNOSIS — J0191 Acute recurrent sinusitis, unspecified: Secondary | ICD-10-CM | POA: Diagnosis not present

## 2023-05-07 DIAGNOSIS — J310 Chronic rhinitis: Secondary | ICD-10-CM | POA: Diagnosis not present

## 2023-05-26 DIAGNOSIS — L738 Other specified follicular disorders: Secondary | ICD-10-CM | POA: Diagnosis not present

## 2023-05-26 DIAGNOSIS — L821 Other seborrheic keratosis: Secondary | ICD-10-CM | POA: Diagnosis not present

## 2023-05-26 DIAGNOSIS — L814 Other melanin hyperpigmentation: Secondary | ICD-10-CM | POA: Diagnosis not present

## 2023-05-26 DIAGNOSIS — L819 Disorder of pigmentation, unspecified: Secondary | ICD-10-CM | POA: Diagnosis not present

## 2023-05-28 DIAGNOSIS — G629 Polyneuropathy, unspecified: Secondary | ICD-10-CM | POA: Diagnosis not present

## 2023-06-25 ENCOUNTER — Other Ambulatory Visit: Payer: Self-pay | Admitting: Medical Genetics

## 2023-07-22 ENCOUNTER — Other Ambulatory Visit (HOSPITAL_COMMUNITY)
Admission: RE | Admit: 2023-07-22 | Discharge: 2023-07-22 | Disposition: A | Discharge status: Home / Self Care | Payer: Self-pay | Source: Ambulatory Visit | Attending: Medical Genetics | Admitting: Medical Genetics

## 2023-08-04 LAB — GENECONNECT MOLECULAR SCREEN: Genetic Analysis Overall Interpretation: NEGATIVE

## 2023-09-03 ENCOUNTER — Encounter: Payer: Self-pay | Admitting: Family Medicine

## 2023-09-03 DIAGNOSIS — F411 Generalized anxiety disorder: Secondary | ICD-10-CM | POA: Diagnosis not present

## 2023-09-03 DIAGNOSIS — I10 Essential (primary) hypertension: Secondary | ICD-10-CM | POA: Diagnosis not present

## 2023-09-03 DIAGNOSIS — Z0000 Encounter for general adult medical examination without abnormal findings: Secondary | ICD-10-CM | POA: Diagnosis not present

## 2023-09-03 DIAGNOSIS — E782 Mixed hyperlipidemia: Secondary | ICD-10-CM | POA: Diagnosis not present

## 2023-09-03 DIAGNOSIS — E041 Nontoxic single thyroid nodule: Secondary | ICD-10-CM | POA: Diagnosis not present

## 2023-09-03 DIAGNOSIS — R7303 Prediabetes: Secondary | ICD-10-CM | POA: Diagnosis not present

## 2023-09-03 LAB — LAB REPORT - SCANNED
A1c: 5.7
Creatinine, POC: 136 mg/dL
EGFR: 68
Microalb Creat Ratio: 6.6
Microalbumin, Urine: 0.9

## 2023-10-15 ENCOUNTER — Ambulatory Visit: Payer: BC Managed Care – PPO | Attending: Cardiology | Admitting: Cardiology

## 2023-10-15 ENCOUNTER — Encounter: Payer: Self-pay | Admitting: Cardiology

## 2023-10-15 VITALS — BP 140/90 | HR 70 | Resp 16 | Ht 68.0 in | Wt 180.0 lb

## 2023-10-15 DIAGNOSIS — I2584 Coronary atherosclerosis due to calcified coronary lesion: Secondary | ICD-10-CM

## 2023-10-15 DIAGNOSIS — I2510 Atherosclerotic heart disease of native coronary artery without angina pectoris: Secondary | ICD-10-CM | POA: Diagnosis not present

## 2023-10-15 DIAGNOSIS — E782 Mixed hyperlipidemia: Secondary | ICD-10-CM | POA: Diagnosis not present

## 2023-10-15 DIAGNOSIS — I700 Atherosclerosis of aorta: Secondary | ICD-10-CM

## 2023-10-15 DIAGNOSIS — I10 Essential (primary) hypertension: Secondary | ICD-10-CM

## 2023-10-15 MED ORDER — NEXLIZET 180-10 MG PO TABS: 1.0000 | ORAL_TABLET | Freq: Every morning | ORAL | 3 refills | Status: DC

## 2023-10-15 NOTE — Progress Notes (Addendum)
 Cardiology Office Note:  .   Date:  10/15/2023  ID:  Christine Nolan, DOB 01/02/1966, MRN [id number] PCP:  Glena Landau, MD  Former Cardiology Providers: Dr. Avery Bodo [facility] Providers Cardiologist:  Olinda Bertrand, DO , [facility] (established care 10/15/23) Electrophysiologist:  None  Click to update primary MD,subspecialty MD or APP then REFRESH:1}    Chief Complaint  Patient presents with   Follow-up    1 year follow up    Coronary artery calcification    History of Present Illness: .   Christine Nolan is a 58 y.o. Caucasian female whose past medical history and cardiovascular risk factors includes: Aortic atherosclerosis, severe coronary artery calcification, aortic valve sclerosis, family history of CAD (daughter diagnosed with scad in the past & mom had CAD in her mid 30s with CABG), hypertension, hyperlipidemia.  Formally under the care of Dr. Avery Bodo who last saw Christine Nolan back in April 2024. I am seeing her for the first time to re-establishing care.   Over the last 1 year patient denies any anginal chest pain or heart failure symptoms.  No structured exercise program or daily routine.  Recently got started on 1 mg p.o. daily.  Office blood pressures are not well-controlled but home blood pressures usually range between 120-130 mmHg.  She has a great relationship with her PCP and follows regularly.  Review of Systems: .   Review of Systems  Cardiovascular:  Negative for chest pain, claudication, irregular heartbeat, leg swelling, near-syncope, orthopnea, palpitations, paroxysmal nocturnal dyspnea and syncope.  Respiratory:  Negative for shortness of breath.   Hematologic/Lymphatic: Negative for bleeding problem.  Musculoskeletal:  Positive for arthritis and joint pain.    Studies Reviewed:   EKG: EKG Interpretation Date/Time:  Wednesday October 15 2023 09:07:35 EDT Ventricular Rate:  66 PR Interval:  148 QRS Duration:  84 QT Interval:  392 QTC  Calculation: 410 R Axis:   55  Text Interpretation: Normal sinus rhythm Normal ECG When compared with ECG of 15-Sep-2017 17:59, No significant change since last tracing Confirmed by Olinda Bertrand [phone]) on 10/15/2023 9:13:43 AM  Echocardiogram: None  CCTA: 09/2022 1. Coronary calcium score of 694. This was 99th percentile for age-,sex, and race-matched controls.   2. Total plaque volume 538 mm3 which is 85th percentile for age- and sex-matched controls (calcified plaque 140 mm3; non-calcified plaque 398 mm3). TPV is severe.   2. Normal coronary origin with co-dominance.   3. There is moderate (50-69%) mixed plaque in the LAD and RI. CAD-RADS 3.   4. Will send for FFRct.  CTFFR 09/2022 1. FFRct findings consistent with non-obstructive coronary artery disease.   2.  Recommend aggressive risk factor modification.  Vascular ultrasound  Aorta: May 2024:Abdominal Aorta: No evidence of an abdominal aortic aneurysm was visualized. The largest aortic measurement is 2.4 cm. Mild atherosclerosis noted throughout aorta.  Stenosis: No evidence of stenosis seen.   Carotid Screen: May 2024:  Right Carotid: Normal: little or no evidence of plaque visualized in the right internal carotid artery. Moderate intimal thickening.  Left Carotid: Normal: little or no evidence of plaque visualized in the left internal carotid artery. Moderate intimal thickening.  RADIOLOGY: NA  Risk Assessment/Calculations:   The 10-year ASCVD risk score (Arnett DK, et al., 2019) is: 4.2%   Values used to calculate the score:     Age: 35 years     Sex: Female     Is Non-Hispanic African American: No  Diabetic: No     Tobacco smoker: No     Systolic Blood Pressure: 140 mmHg     Is BP treated: Yes     HDL Cholesterol: 52 mg/dL     Total Cholesterol: 161 mg/dL    Labs:       Latest Ref Rng & Units 02/25/2020   11:09 AM 02/25/2020   10:52 AM 09/15/2017    6:18 PM  CBC  WBC 4.0 - 10.5 K/uL  7.8  8.4    Hemoglobin 12.0 - 15.0 g/dL 09.8  11.9  14.7   Hematocrit 36.0 - 46.0 % 41.0  40.7  43.5   Platelets 150 - 400 K/uL  226  252        Latest Ref Rng & Units 10/14/2022   11:01 AM 01/25/2022    2:33 PM 02/25/2020   11:09 AM  BMP  Glucose 70 - 99 mg/dL 829  90  95   BUN 6 - 24 mg/dL 19  20  16    Creatinine 0.57 - 1.00 mg/dL 5.62  1.30  8.65   BUN/Creat Ratio 9 - 23 21  NOT APPLICABLE    Sodium 134 - 144 mmol/L 135  138  140   Potassium 3.5 - 5.2 mmol/L 4.6  4.3  4.0   Chloride 96 - 106 mmol/L 100  102  105   CO2 20 - 29 mmol/L 23  25    Calcium 8.7 - 10.2 mg/dL 78.4  69.6        Latest Ref Rng & Units 11/28/2022    7:41 AM 10/14/2022   11:01 AM 01/25/2022    2:33 PM  CMP  Glucose 70 - 99 mg/dL  295  90   BUN 6 - 24 mg/dL  19  20   Creatinine 2.84 - 1.00 mg/dL  1.32  4.40   Sodium 102 - 144 mmol/L  135  138   Potassium 3.5 - 5.2 mmol/L  4.6  4.3   Chloride 96 - 106 mmol/L  100  102   CO2 20 - 29 mmol/L  23  25   Calcium 8.7 - 10.2 mg/dL  72.5  36.6   Total Protein 6.0 - 8.5 g/dL 7.5   8.3   Total Bilirubin 0.0 - 1.2 mg/dL 0.3   0.4   Alkaline Phos 44 - 121 IU/L 70     AST 0 - 40 IU/L 18   19   ALT 0 - 32 IU/L 19   21     Lab Results  Component Value Date   CHOL 161 11/28/2022   HDL 52 11/28/2022   LDLCALC 87 11/28/2022   TRIG 125 11/28/2022   CHOLHDL 3.1 11/28/2022   No results for input(s): "LIPOA" in the last 8760 hours. No components found for: "NTPROBNP" No results for input(s): "PROBNP" in the last 8760 hours. No results for input(s): "TSH" in the last 8760 hours.  External Labs: Collected: September 03, 2023 [facility] database Total cholesterol 135, triglycerides 95, HDL 46, LDL 71. Hemoglobin 14 A1c 5.7. TSH 2.5  Physical Exam:    Today's Vitals   10/15/23 0904  BP: (!) 140/90  Pulse: 70  Resp: 16  SpO2: 97%  Weight: 180 lb (81.6 kg)  Height: 5\' 8"  (1.727 m)   Body mass index is 27.37 kg/m. Wt Readings from Last 3 Encounters:  10/15/23 180 lb (81.6 kg)   10/14/22 173 lb 9.6 oz (78.7 kg)  08/28/21 178 lb (80.7 kg)  Physical Exam  Constitutional: No distress.  hemodynamically stable  Neck: No JVD present.  Cardiovascular: Normal rate, regular rhythm, S1 normal and S2 normal. Exam reveals no gallop, no S3 and no S4.  No murmur heard. Pulmonary/Chest: Effort normal and breath sounds normal. No stridor. She has no wheezes. She has no rales.  Musculoskeletal:        General: No edema.     Cervical back: Neck supple.  Skin: Skin is warm.     Impression & Recommendation(s):  Impression:   ICD-10-CM   1. Coronary atherosclerosis due to calcified coronary lesion of native artery  I25.10 EKG 12-Lead   I25.84 aspirin 81 MG chewable tablet    ECHOCARDIOGRAM COMPLETE    Bempedoic Acid-Ezetimibe (NEXLIZET ) 180-10 MG TABS    2. Aortic atherosclerosis (HCC)  I70.0     3. Mixed hyperlipidemia  E78.2 Comprehensive metabolic panel with GFR    Lipid panel    Lipid panel    Comprehensive metabolic panel with GFR    4. Essential hypertension  I10        Recommendation(s):  Coronary atherosclerosis due to calcified coronary lesion of native artery Coronary calcium score of 694, 99 percentile Total plaque volume 538 mm3 which is 85th percentile for age- and sex-matched controls  He denies anginal chest pain or heart failure symptoms. EKG today is nonischemic. Echo will be ordered to evaluate for structural heart disease and left ventricular systolic function. Continue aspirin 81 mg p.o. daily. Continue rosuvastatin 20 mg p.o. nightly. Outside labs independently reviewed LDL is acceptable but not at goal, recommend a goal LDL <55 mg/dL. Will add Nexlizet  with follow-up labs in 6 weeks   Aortic atherosclerosis (HCC) Continue dual antiplatelet therapy  Mixed hyperlipidemia Currently on rosuvastatin 20 mg p.o. daily.   She denies myalgia or other side effects. Most recent lipids dated September 03, 2023 [facility] database, independently reviewed  as noted above.  LDL is 71 mg/dL.  Patient has severe coronary calcification and severe total plaque volume based on her last coronary CTA, she has multiple family members with coronary artery disease, would like to reduce her LDL closer to 55 mg/dL. Start Nexlizet  180/10 milligrams p.o. daily medication profile discussed  Essential hypertension Office blood pressure not at goal. Home blood pressures usually range between 120-130 mmHg on current medical therapy. He stated he was at her PCP office last week and blood pressure was 124/72. Low-salt diet recommended. Cardiology following peripherally, managed by primary care provider.  Seeing the patient for the first time. I reviewed the last progress note from  Dr. Avery Bodo April 2024, reviewed the coronary CTA results, EKG, outside labs from [facility] database March 2025, discussed medication adjustment, lipid management and goals, ordered additional diagnostic workup and follow-up tests.  Orders Placed:  Orders Placed This Encounter  Procedures   Comprehensive metabolic panel with GFR    Standing Status:   Future    Number of Occurrences:   1    Expected Date:   11/26/2023    Expiration Date:   10/14/2024   Lipid panel    Standing Status:   Future    Number of Occurrences:   1    Expected Date:   11/26/2023    Expiration Date:   10/14/2024   EKG 12-Lead   ECHOCARDIOGRAM COMPLETE    Standing Status:   Future    Expected Date:   08/30/2024    Expiration Date:   10/14/2024    Where should this  test be performed:   [facility])    Does the patient weigh less than or greater than 250 lbs?:   Patient weighs less than 250 lbs    Perflutren DEFINITY (image enhancing agent) should be administered unless hypersensitivity or allergy exist:   Administer Perflutren    Reason for exam-Echo:   Other-Full Diagnosis List    Full ICD-10/Reason for Exam:   CAD (coronary artery disease) [578469]     Final Medication List:     Meds ordered this encounter  Medications   Bempedoic Acid-Ezetimibe (NEXLIZET ) 180-10 MG TABS    Sig: Take 1 tablet by mouth in the morning.    Dispense:  30 tablet    Refill:  3    Medications Discontinued During This Encounter  Medication Reason   Diethylpropion HCl CR 75 MG TB24 Patient Preference   polyethylene glycol-electrolytes (GAVILYTE-N WITH FLAVOR PACK) 420 g solution Patient Preference   predniSONE  (DELTASONE ) 50 MG tablet No longer needed (for PRN medications)     Current Outpatient Medications:    acetaminophen  (TYLENOL ) 500 MG tablet, Take 1,000 mg by mouth every 6 (six) hours as needed., Disp: , Rfl:    aspirin 81 MG chewable tablet, Chew 81 mg by mouth daily., Disp: , Rfl:    Bempedoic Acid-Ezetimibe (NEXLIZET ) 180-10 MG TABS, Take 1 tablet by mouth in the morning., Disp: 30 tablet, Rfl: 3   Calcium Carbonate-Vitamin D3 600-400 MG-UNIT TABS, Take 1 tablet by mouth every evening. , Disp: , Rfl:    dimenhyDRINATE (DRAMAMINE) 50 MG tablet, Take 50 mg by mouth every 8 (eight) hours as needed., Disp: , Rfl:    diphenhydrAMINE (BENADRYL) 25 MG tablet, Take 25 mg by mouth every 6 (six) hours as needed., Disp: , Rfl:    estradiol (ESTRACE) 2 MG tablet, Take 2 mg by mouth every evening. , Disp: , Rfl:    LORazepam (ATIVAN) 0.5 MG tablet, lorazepam 0.5 mg tablet  TAKE 1/2 TO 1 TABLET TWICE DAILY AS NEEDED FOR ANXIETY, Disp: , Rfl:    medroxyPROGESTERone (PROVERA) 5 MG tablet, Take 0.5-1 tablets by mouth daily., Disp: , Rfl:    metoprolol succinate (TOPROL-XL) 50 MG 24 hr tablet, Take 50 mg by mouth every evening. Take with or immediately following a meal., Disp: , Rfl:    Multiple Vitamins-Minerals (MULTIVITAMIN ADULT PO), Take 1 tablet by mouth daily. , Disp: , Rfl:    Omega-3 Fatty Acids (FISH OIL) 1200 MG CPDR, Take 2,400 mg by mouth in the morning and at bedtime., Disp: , Rfl:    rosuvastatin (CRESTOR) 20 MG tablet, Take 1 tablet by mouth daily., Disp: , Rfl:    sertraline  (ZOLOFT) 50 MG tablet, Take 50 mg by mouth every evening. , Disp: , Rfl:   Consent:   NA  Disposition:   1 year follow-up sooner if needed  Her questions and concerns were addressed to her satisfaction. She voices understanding of the recommendations provided during this encounter.    Signed, Olinda Bertrand, DO, [facility]  [facility]  [address] [place], [place] [address] 10/15/2023 9:36 AM  Addendum:  Corrected the name of her statin under the HLD section.   Awilda Bogus, [facility]  A Division of [facility] [address], [place], [place] [address]  [place], [place] [address] 11:26 AM 12/01/23

## 2023-10-15 NOTE — Patient Instructions (Addendum)
 Medication Instructions:  Your physician has recommended you make the following change in your medication:   START Nexlizet 180-10 mg once daily in the morning    *If you need a refill on your cardiac medications before your next appointment, please call your pharmacy*  Lab Work: To be completed in 6 weeks: FASTING lipid panel, CMP (Approximately 11/26/23)  If you have labs (blood work) drawn today and your tests are completely normal, you will receive your results only by: MyChart Message (if you have MyChart) OR A paper copy in the mail If you have any lab test that is abnormal or we need to change your treatment, we will call you to review the results.  Testing/Procedures: Your physician has requested that you have an echocardiogram prior to the 1 year follow-up with Dr. Albert Huff. Echocardiography is a painless test that uses sound waves to create images of your heart. It provides your doctor with information about the size and shape of your heart and how well your heart's chambers and valves are working. This procedure takes approximately one hour. There are no restrictions for this procedure. Please do NOT wear cologne, perfume, aftershave, or lotions (deodorant is allowed). Please arrive 15 minutes prior to your appointment time.  Please note: We ask at that you not bring children with you during ultrasound (echo/ vascular) testing. Due to room size and safety concerns, children are not allowed in the ultrasound rooms during exams. Our front office staff cannot provide observation of children in our lobby area while testing is being conducted. An adult accompanying a patient to their appointment will only be allowed in the ultrasound room at the discretion of the ultrasound technician under special circumstances. We apologize for any inconvenience.   Follow-Up: At [facility], you and your health needs are our priority.  As part of our continuing mission to provide you with exceptional  heart care, we have created designated Provider Care Teams.  These Care Teams include your primary Cardiologist (physician) and Advanced Practice Providers (APPs -  Physician Assistants and Nurse Practitioners) who all work together to provide you with the care you need, when you need it.  Your next appointment:   1 year(s)  The format for your next appointment:   In Person  Provider:   Olinda Bertrand, DO{  Other Instructions    1st Floor: - Lobby - Registration  - Pharmacy  - Lab - Cafe  2nd Floor: - PV Lab - Diagnostic Testing (echo, CT, nuclear med)  3rd Floor: - Vacant  4th Floor: - TCTS (cardiothoracic surgery) - AFib Clinic - Structural Heart Clinic - Vascular Surgery  - Vascular Ultrasound  5th Floor: - HeartCare Cardiology (general and EP) - Clinical Pharmacy for coumadin, hypertension, lipid, weight-loss medications, and med management appointments    Valet parking services will be available as well.

## 2023-10-17 NOTE — Telephone Encounter (Signed)
 I believe we reviewed theses question 2 days ago at the visit. But in summary...  what is causing my score to be so high?   - cholesterol /triglycerides likely contributing factors (that are modifiable).  Will my score ever decrease? -   Calcium scores on repeat scan usually stay the same but could increase  what can I do to lower this score?  - Risk factor modifications especially optimizing lipids and triglyceride levels.  Heart healthy diet and secondary prevention.   Why didn't they tell me this last year after the CT scan?  - Not sure what was discussed as you were with a different provider.   Can we go in and remove the stuff causing the blockage? -In a asymptomatic patient and given your CT-FFR results it is not standard practice for additional procedures as the risks are higher than the benefits.   Christine Bowmer Belknap, DO, [facility]

## 2023-10-20 DIAGNOSIS — Z1231 Encounter for screening mammogram for malignant neoplasm of breast: Secondary | ICD-10-CM | POA: Diagnosis not present

## 2023-10-20 DIAGNOSIS — Z01419 Encounter for gynecological examination (general) (routine) without abnormal findings: Secondary | ICD-10-CM | POA: Diagnosis not present

## 2023-10-20 DIAGNOSIS — R87810 Cervical high risk human papillomavirus (HPV) DNA test positive: Secondary | ICD-10-CM | POA: Diagnosis not present

## 2023-10-20 DIAGNOSIS — Z124 Encounter for screening for malignant neoplasm of cervix: Secondary | ICD-10-CM | POA: Diagnosis not present

## 2023-11-26 DIAGNOSIS — E782 Mixed hyperlipidemia: Secondary | ICD-10-CM | POA: Diagnosis not present

## 2023-11-26 LAB — LIPID PANEL

## 2023-11-27 LAB — COMPREHENSIVE METABOLIC PANEL WITH GFR
ALT: 50 IU/L — ABNORMAL HIGH (ref 0–32)
AST: 48 IU/L — ABNORMAL HIGH (ref 0–40)
Albumin: 4.6 g/dL (ref 3.8–4.9)
Alkaline Phosphatase: 49 IU/L (ref 44–121)
BUN/Creatinine Ratio: 17 (ref 9–23)
BUN: 17 mg/dL (ref 6–24)
Bilirubin Total: 0.4 mg/dL (ref 0.0–1.2)
CO2: 20 mmol/L (ref 20–29)
Calcium: 9.4 mg/dL (ref 8.7–10.2)
Chloride: 102 mmol/L (ref 96–106)
Creatinine, Ser: 1.03 mg/dL — ABNORMAL HIGH (ref 0.57–1.00)
Globulin, Total: 3.1 g/dL (ref 1.5–4.5)
Glucose: 97 mg/dL (ref 70–99)
Potassium: 4.8 mmol/L (ref 3.5–5.2)
Sodium: 139 mmol/L (ref 134–144)
Total Protein: 7.7 g/dL (ref 6.0–8.5)
eGFR: 63 mL/min/{1.73_m2} (ref >59)

## 2023-11-27 LAB — LIPID PANEL
Cholesterol, Total: 99 mg/dL — ABNORMAL LOW (ref 100–199)
HDL: 39 mg/dL — ABNORMAL LOW (ref >39)
LDL CALC COMMENT:: 2.5 ratio (ref 0.0–4.4)
LDL Chol Calc (NIH): 38 mg/dL (ref 0–99)
Triglycerides: 126 mg/dL (ref 0–149)
VLDL Cholesterol Cal: 22 mg/dL (ref 5–40)

## 2023-11-28 ENCOUNTER — Ambulatory Visit: Payer: Self-pay

## 2023-11-28 DIAGNOSIS — E782 Mixed hyperlipidemia: Secondary | ICD-10-CM

## 2023-12-24 DIAGNOSIS — I10 Essential (primary) hypertension: Secondary | ICD-10-CM | POA: Diagnosis not present

## 2023-12-24 DIAGNOSIS — J019 Acute sinusitis, unspecified: Secondary | ICD-10-CM | POA: Diagnosis not present

## 2023-12-24 DIAGNOSIS — J988 Other specified respiratory disorders: Secondary | ICD-10-CM | POA: Diagnosis not present

## 2024-01-09 DIAGNOSIS — E782 Mixed hyperlipidemia: Secondary | ICD-10-CM | POA: Diagnosis not present

## 2024-01-09 LAB — LIPID PANEL
Chol/HDL Ratio: 3.5 ratio (ref 0.0–4.4)
Cholesterol, Total: 159 mg/dL (ref 100–199)
HDL: 46 mg/dL (ref >39)
LDL Chol Calc (NIH): 87 mg/dL (ref 0–99)
Triglycerides: 149 mg/dL (ref 0–149)
VLDL Cholesterol Cal: 26 mg/dL (ref 5–40)

## 2024-01-09 LAB — COMPREHENSIVE METABOLIC PANEL WITH GFR
ALT: 41 IU/L — ABNORMAL HIGH (ref 0–32)
AST: 31 IU/L (ref 0–40)
Albumin: 4.3 g/dL (ref 3.8–4.9)
Alkaline Phosphatase: 61 IU/L (ref 44–121)
BUN/Creatinine Ratio: 18 (ref 9–23)
BUN: 17 mg/dL (ref 6–24)
Bilirubin Total: 0.4 mg/dL (ref 0.0–1.2)
CO2: 19 mmol/L — ABNORMAL LOW (ref 20–29)
Calcium: 9.4 mg/dL (ref 8.7–10.2)
Chloride: 102 mmol/L (ref 96–106)
Creatinine, Ser: 0.96 mg/dL (ref 0.57–1.00)
Globulin, Total: 3.3 g/dL (ref 1.5–4.5)
Glucose: 98 mg/dL (ref 70–99)
Potassium: 4.2 mmol/L (ref 3.5–5.2)
Sodium: 139 mmol/L (ref 134–144)
Total Protein: 7.6 g/dL (ref 6.0–8.5)
eGFR: 68 mL/min/1.73 (ref >59)

## 2024-01-29 DIAGNOSIS — M1812 Unilateral primary osteoarthritis of first carpometacarpal joint, left hand: Secondary | ICD-10-CM | POA: Diagnosis not present

## 2024-02-13 DIAGNOSIS — R300 Dysuria: Secondary | ICD-10-CM | POA: Diagnosis not present

## 2024-02-13 DIAGNOSIS — N390 Urinary tract infection, site not specified: Secondary | ICD-10-CM | POA: Diagnosis not present

## 2024-02-20 DIAGNOSIS — R7989 Other specified abnormal findings of blood chemistry: Secondary | ICD-10-CM | POA: Diagnosis not present

## 2024-02-20 DIAGNOSIS — R945 Abnormal results of liver function studies: Secondary | ICD-10-CM | POA: Diagnosis not present

## 2024-02-20 DIAGNOSIS — K7689 Other specified diseases of liver: Secondary | ICD-10-CM | POA: Diagnosis not present

## 2024-02-23 DIAGNOSIS — R945 Abnormal results of liver function studies: Secondary | ICD-10-CM | POA: Diagnosis not present

## 2024-03-11 ENCOUNTER — Encounter: Payer: Self-pay | Admitting: Podiatry

## 2024-03-11 ENCOUNTER — Ambulatory Visit: Admitting: Podiatry

## 2024-03-11 ENCOUNTER — Telehealth: Payer: Self-pay | Admitting: Podiatry

## 2024-03-11 DIAGNOSIS — S90229A Contusion of unspecified lesser toe(s) with damage to nail, initial encounter: Secondary | ICD-10-CM | POA: Diagnosis not present

## 2024-03-11 MED ORDER — NEOMYCIN-POLYMYXIN-HC 3.5-10000-1 OT SUSP: OTIC | 0 refills | Status: AC

## 2024-03-11 NOTE — Telephone Encounter (Signed)
 Patient states mid July she stubbed her second toe and left foot toe. Nail turned black and blue and is still currently black. The nail bed has lifted and is only attached at the base. She doesn't know what to do. Will the nail fall off ? Should she come in for an appointment?

## 2024-03-11 NOTE — Telephone Encounter (Signed)
 Patient is scheduled for extended care this evening.Thank you both for getting back so quickly.

## 2024-03-11 NOTE — Patient Instructions (Signed)

## 2024-03-11 NOTE — Progress Notes (Unsigned)
 Chief Complaint  Patient presents with   Nail Problem    Patient is here for left 2nd nail detached after nail trauma 2 months ago    HPI: 58 y.o. female presents today with concern for loose second toenail which has been becoming detached following injury about 2 months ago.  She does report pain when putting pressure on the nail and trying to move it.  She reports originally hitting the nail.  Developed bruising underneath it.  She is concerned about getting ripped off and causing an injury.  Past Medical History:  Diagnosis Date   CAD (coronary artery disease)    Hematuria, unspecified    none recent   Hyperlipidemia    Hypertension    PMB (postmenopausal bleeding)    Pneumonia 08/1998   bacterial   Thyroid  nodule    followed by dr loreli   UTI (urinary tract infection)    hx of    Past Surgical History:  Procedure Laterality Date   CERVICAL FUSION  11/2010   plating and cadaver bone   colonscopy  10/2019   1 benign polyp   CYST REMOVAL HAND Right before 2006   wrist   CYSTOSCOPY  yrs ago   HYSTEROSCOPY WITH D & C N/A 02/25/2020   Procedure: DILATATION AND CURETTAGE /HYSTEROSCOPY;  Surgeon: Gretta Gums, MD;  Location: [facility];  Service: Gynecology;  Laterality: N/A;   KNEE SURGERY Left 01/2000   knee cap  repair of patella   left foot surgery  age 36   TUBAL LIGATION  before 2006   WISDOM TOOTH EXTRACTION  2009    Allergies  Allergen Reactions   Iodine Anaphylaxis    From foods   Nexlizet  [Bempedoic Acid-Ezetimibe] Other (See Comments)    Elevation of LFTs   Shellfish Allergy Swelling    EYES SWELLING AND THROAT TIGHTEN in food    ROS    Physical Exam: There were no vitals filed for this visit.  General: The patient is alert and oriented x3 in no acute distress.  Dermatology: Skin is warm, dry and supple bilateral lower extremities. Interspaces are clear of maceration and debris.    Left second toenail with subungual debris  present.  Loosely adhered to the nailbed, mostly attached to medial aspect.  It is tender on palpation.  Dried blood underneath the nail.  Vascular: Palpable pedal pulses bilaterally. Capillary refill within normal limits.  No appreciable edema.  No erythema or calor.  Neurological: Light touch sensation grossly intact bilateral feet.   Musculoskeletal Exam: Muscle strength 5/5 all major muscle groups.  No symptomatic limitations in pedal range of motion.  Assessment/Plan of Care: 1. Subungual hematoma of second toe      Meds ordered this encounter  Medications   neomycin -polymyxin-hydrocortisone (CORTISPORIN) 3.5-10000-1 OTIC suspension    Sig: Apply 1-2 drops daily after soaking and cover with bandaid    Dispense:  10 mL    Refill:  0   None  Discussed clinical findings with patient today.  Discussed removal of the nail plate.  It is loose adhered.  Patient is concerned about pain associated with this.  Written consent was obtained to perform total nail avulsion and remove the toenail.  The toe was anesthetized with 3 cc of a one-to-one ratio of 2% lidocaine  plain mixed with 0.5% Marcaine plain.  The toe was then prepped with Betadine.  The nail was able to be removed using sterile nail nippers without  incident.  The underlying nailbed appeared well-healed.  Primarily was attached to the medial nail fold.  No obvious underlying wound present or signs of infection at this point.  Silvadene and bandage applied.  Discussed findings with patient.  She can perform Epsom salt soaks and dressing changes as needed if there is any underlying bleeding or wound present.  Can follow-up as needed if she is noticing any problems with healing, or if she has concerns as the nail starts to grow out again.   Keanu Frickey L. Lamount MAUL, AACFAS [facility]     [address] [place], [place] [address]                Office [phone]  Fax [phone]

## 2024-03-31 DIAGNOSIS — E782 Mixed hyperlipidemia: Secondary | ICD-10-CM | POA: Diagnosis not present

## 2024-04-08 ENCOUNTER — Ambulatory Visit: Payer: Self-pay | Admitting: Cardiology

## 2024-06-04 ENCOUNTER — Ambulatory Visit (HOSPITAL_COMMUNITY): Admission: RE | Admit: 2024-06-04 | Discharge: 2024-06-04 | Attending: Cardiology

## 2024-06-04 DIAGNOSIS — I2584 Coronary atherosclerosis due to calcified coronary lesion: Secondary | ICD-10-CM | POA: Diagnosis not present

## 2024-06-04 DIAGNOSIS — I2510 Atherosclerotic heart disease of native coronary artery without angina pectoris: Secondary | ICD-10-CM

## 2024-06-04 LAB — ECHOCARDIOGRAM COMPLETE
Area-P 1/2: 4.04 cm2
S' Lateral: 3.15 cm

## 2024-10-14 ENCOUNTER — Ambulatory Visit: Admitting: Cardiology
